# Patient Record
Sex: Female | Born: 1985 | Race: Black or African American | Hispanic: No | Marital: Single | Smoking: Current some day smoker
Health system: Southern US, Community
[De-identification: ages and names within clinical notes are randomized; demographics above are authoritative.]

## PROBLEM LIST (undated history)

## (undated) ENCOUNTER — Emergency Department (HOSPITAL_COMMUNITY): Admission: EM | Payer: Self-pay | Source: Home / Self Care

---
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
## 2008-05-31 ENCOUNTER — Ambulatory Visit: Payer: Self-pay | Admitting: Internal Medicine

## 2009-01-20 ENCOUNTER — Ambulatory Visit: Payer: Self-pay | Admitting: Internal Medicine

## 2009-05-15 ENCOUNTER — Ambulatory Visit: Payer: Self-pay | Admitting: Internal Medicine

## 2009-05-15 ENCOUNTER — Encounter: Payer: Self-pay | Admitting: Family Medicine

## 2009-05-15 LAB — CONVERTED CEMR LAB
AST: 11 units/L (ref 0–37)
Albumin: 4.5 g/dL (ref 3.5–5.2)
Alkaline Phosphatase: 87 units/L (ref 39–117)
Barbiturate Quant, Ur: NEGATIVE
Basophils Absolute: 0.1 10*3/uL (ref 0.0–0.1)
Cocaine Metabolites: NEGATIVE
Creatinine,U: 204.1 mg/dL
HDL: 40 mg/dL (ref >39)
Hemoglobin: 12.9 g/dL (ref 12.0–15.0)
LDL Cholesterol: 91 mg/dL (ref 0–99)
Lymphocytes Relative: 21 % (ref 12–46)
Neutro Abs: 6.7 10*3/uL (ref 1.7–7.7)
Neutrophils Relative %: 68 % (ref 43–77)
Phencyclidine (PCP): NEGATIVE
Platelets: 302 10*3/uL (ref 150–400)
Potassium: 4.1 meq/L (ref 3.5–5.3)
Propoxyphene: NEGATIVE
RDW: 12.6 % (ref 11.5–15.5)
Sodium: 140 meq/L (ref 135–145)
TSH: 0.904 microintl units/mL (ref 0.350–4.500)
Total Bilirubin: 0.4 mg/dL (ref 0.3–1.2)
Total Protein: 6.7 g/dL (ref 6.0–8.3)
VLDL: 7 mg/dL (ref 0–40)

## 2009-06-02 ENCOUNTER — Ambulatory Visit: Payer: Self-pay | Admitting: Internal Medicine

## 2009-06-02 ENCOUNTER — Other Ambulatory Visit: Admission: RE | Admit: 2009-06-02 | Discharge: 2009-06-02 | Payer: Self-pay | Admitting: Family Medicine

## 2009-06-02 ENCOUNTER — Encounter: Payer: Self-pay | Admitting: Family Medicine

## 2009-06-02 LAB — CONVERTED CEMR LAB: Chlamydia, DNA Probe: NEGATIVE

## 2009-07-21 ENCOUNTER — Ambulatory Visit: Payer: Self-pay | Admitting: Internal Medicine

## 2009-11-04 ENCOUNTER — Ambulatory Visit: Payer: Self-pay | Admitting: Internal Medicine

## 2009-11-20 ENCOUNTER — Ambulatory Visit: Payer: Self-pay | Admitting: Internal Medicine

## 2010-10-08 ENCOUNTER — Emergency Department (HOSPITAL_COMMUNITY): Admission: EM | Admit: 2010-10-08 | Discharge: 2010-06-16 | Payer: Self-pay | Admitting: Emergency Medicine

## 2010-11-04 ENCOUNTER — Observation Stay (HOSPITAL_COMMUNITY)
Admission: EM | Admit: 2010-11-04 | Discharge: 2010-11-04 | Payer: Self-pay | Source: Home / Self Care | Admitting: Emergency Medicine

## 2011-01-04 ENCOUNTER — Emergency Department (HOSPITAL_COMMUNITY)
Admission: EM | Admit: 2011-01-04 | Discharge: 2011-01-04 | Discharge status: Against Medical Advice | Payer: Self-pay | Attending: Emergency Medicine | Admitting: Emergency Medicine

## 2011-01-04 DIAGNOSIS — Z0389 Encounter for observation for other suspected diseases and conditions ruled out: Secondary | ICD-10-CM | POA: Insufficient documentation

## 2011-01-06 ENCOUNTER — Emergency Department (HOSPITAL_COMMUNITY)
Admission: EM | Admit: 2011-01-06 | Discharge: 2011-01-06 | Disposition: A | Discharge status: Home / Self Care | Payer: No Typology Code available for payment source | Attending: Emergency Medicine | Admitting: Emergency Medicine

## 2011-01-06 ENCOUNTER — Emergency Department (HOSPITAL_COMMUNITY): Payer: No Typology Code available for payment source

## 2011-01-06 DIAGNOSIS — R51 Headache: Secondary | ICD-10-CM | POA: Insufficient documentation

## 2011-01-06 DIAGNOSIS — S139XXA Sprain of joints and ligaments of unspecified parts of neck, initial encounter: Secondary | ICD-10-CM | POA: Insufficient documentation

## 2011-01-06 DIAGNOSIS — M542 Cervicalgia: Secondary | ICD-10-CM | POA: Insufficient documentation

## 2013-03-19 ENCOUNTER — Emergency Department (INDEPENDENT_AMBULATORY_CARE_PROVIDER_SITE_OTHER)
Admission: EM | Admit: 2013-03-19 | Discharge: 2013-03-19 | Disposition: A | Discharge status: Home / Self Care | Payer: BC Managed Care – PPO | Source: Home / Self Care | Attending: Family Medicine | Admitting: Family Medicine

## 2013-03-19 ENCOUNTER — Emergency Department (INDEPENDENT_AMBULATORY_CARE_PROVIDER_SITE_OTHER): Payer: BC Managed Care – PPO

## 2013-03-19 ENCOUNTER — Encounter (HOSPITAL_COMMUNITY): Payer: Self-pay | Admitting: Emergency Medicine

## 2013-03-19 DIAGNOSIS — M549 Dorsalgia, unspecified: Secondary | ICD-10-CM

## 2013-03-19 DIAGNOSIS — M25569 Pain in unspecified knee: Secondary | ICD-10-CM

## 2013-03-19 DIAGNOSIS — M25519 Pain in unspecified shoulder: Secondary | ICD-10-CM

## 2013-03-19 MED ORDER — DICLOFENAC POTASSIUM 50 MG PO TABS: 50.0000 mg | ORAL_TABLET | Freq: Three times a day (TID) | ORAL | Status: AC
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
## 2013-03-19 NOTE — ED Notes (Signed)
Instructed to put on gown for physician examination 

## 2015-07-02 ENCOUNTER — Encounter (HOSPITAL_COMMUNITY): Payer: Self-pay | Admitting: *Deleted

## 2015-07-02 ENCOUNTER — Emergency Department (HOSPITAL_COMMUNITY)
Admission: EM | Admit: 2015-07-02 | Discharge: 2015-07-02 | Disposition: A | Discharge status: Home / Self Care | Payer: Self-pay | Attending: Emergency Medicine | Admitting: Emergency Medicine

## 2015-07-02 ENCOUNTER — Encounter (HOSPITAL_COMMUNITY): Payer: Self-pay | Admitting: Family Medicine

## 2015-07-02 ENCOUNTER — Emergency Department (INDEPENDENT_AMBULATORY_CARE_PROVIDER_SITE_OTHER)
Admission: EM | Admit: 2015-07-02 | Discharge: 2015-07-02 | Disposition: A | Discharge status: Other Acute Inpatient Hospital | Payer: Self-pay | Source: Home / Self Care | Attending: Family Medicine | Admitting: Family Medicine

## 2015-07-02 DIAGNOSIS — L03317 Cellulitis of buttock: Secondary | ICD-10-CM | POA: Insufficient documentation

## 2015-07-02 DIAGNOSIS — Z720 Tobacco use: Secondary | ICD-10-CM | POA: Insufficient documentation

## 2015-07-02 DIAGNOSIS — L0231 Cutaneous abscess of buttock: Secondary | ICD-10-CM | POA: Insufficient documentation

## 2015-07-02 DIAGNOSIS — Z791 Long term (current) use of non-steroidal anti-inflammatories (NSAID): Secondary | ICD-10-CM | POA: Insufficient documentation

## 2015-07-02 DIAGNOSIS — L0501 Pilonidal cyst with abscess: Secondary | ICD-10-CM

## 2015-07-02 DIAGNOSIS — L0291 Cutaneous abscess, unspecified: Secondary | ICD-10-CM

## 2015-07-02 DIAGNOSIS — L0390 Cellulitis, unspecified: Secondary | ICD-10-CM

## 2015-07-02 MED ORDER — LIDOCAINE-EPINEPHRINE 2 %-1:100000 IJ SOLN
20.0000 mL | Freq: Once | INTRAMUSCULAR | Status: AC
  Administered 2015-07-02: 20 mL via INTRADERMAL
  Filled 2015-07-02: qty 20

## 2015-07-02 MED ORDER — CEPHALEXIN 500 MG PO CAPS: 500.0000 mg | ORAL_CAPSULE | Freq: Two times a day (BID) | ORAL | Status: DC

## 2015-07-02 NOTE — ED Notes (Signed)
Pt  Reports  Symptoms  Of  An  abcess      Swollen  Tender  Area to  Top  Of  Her  Buttock  Area    X  1 week         she  Reports  It is  Tender  To the  Touch

## 2015-07-02 NOTE — ED Provider Notes (Addendum)
CSN: [id number]     Arrival date & time 07/02/15  1609 History   First MD Initiated Contact with Patient 07/02/15 1637     Chief Complaint  Patient presents with  . Abscess   (Consider location/radiation/quality/duration/timing/severity/associated sxs/prior Treatment) Patient is a 29 y.o. female presenting with abscess. The history is provided by the patient.  Abscess Location:  Ano-genital Ano-genital abscess location:  Gluteal cleft Abscess quality: fluctuance, painful and redness   Abscess quality: not draining   Red streaking: no   Duration:  1 week Progression:  Worsening Pain details:    Quality:  Throbbing   Severity:  Moderate   Progression:  Worsening Chronicity:  Recurrent Relieved by:  None tried Worsened by:  Nothing tried Ineffective treatments:  None tried Risk factors: prior abscess     History reviewed. No pertinent past medical history. History reviewed. No pertinent past surgical history. History reviewed. No pertinent family history. Social History  Substance Use Topics  . Smoking status: Current Every Day Smoker  . Smokeless tobacco: None  . Alcohol Use: Yes   OB History    No data available     Review of Systems  Constitutional: Negative.   Gastrointestinal: Positive for rectal pain.  Skin: Positive for rash.    Allergies  Review of patient's allergies indicates no known allergies.  Home Medications   Prior to Admission medications   Medication Sig Start Date End Date Taking? Authorizing Provider  cephALEXin (KEFLEX) 500 MG capsule Take 1 capsule (500 mg total) by mouth 2 (two) times daily. 07/02/15   Barrett Henle, PA-C  diclofenac (CATAFLAM) 50 MG tablet Take 1 tablet (50 mg total) by mouth 3 (three) times daily. 03/19/13   Linna Hoff, MD   Meds Ordered and Administered this Visit  Medications - No data to display  BP 108/66 mmHg  Pulse 93  Temp(Src) 98.1 F (36.7 C)  Resp 16  SpO2 98%  LMP 07/02/2015 No data  found.   Physical Exam  Constitutional: She is oriented to person, place, and time. She appears well-developed and well-nourished. She appears distressed.  Abdominal: Soft. Bowel sounds are normal.  Genitourinary:     Neurological: She is alert and oriented to person, place, and time.  Skin: Skin is warm and dry. There is erythema.  Nursing note and vitals reviewed.   ED Course  Procedures (including critical care time)  Labs Review Labs Reviewed - No data to display  Imaging Review No results found.   Visual Acuity Review  Right Eye Distance:   Left Eye Distance:   Bilateral Distance:    Right Eye Near:   Left Eye Near:    Bilateral Near:         MDM   1. Pilonidal cyst with abscess        Linna Hoff, MD 07/02/15 1710  Linna Hoff, MD 07/02/15 2119

## 2015-07-02 NOTE — ED Notes (Signed)
Pt here for abscess to top of buttocks x 1 week. sts draining.

## 2015-07-02 NOTE — Discharge Instructions (Signed)
Please take Keflex as prescribed.  Please follow up with a primary care provider listed in the Resource Guide provided below. Please return to the Emergency Department if symptoms worsen or new onset of fever, abdominal pain, nausea, vomiting, diarrhea, constipation, blood in stool.    Emergency Department Resource Guide 1) Find a Doctor and Pay Out of Pocket Although you won't have to find out who is covered by your insurance plan, it is a good idea to ask around and get recommendations. You will then need to call the office and see if the doctor you have chosen will accept you as a new patient and what types of options they offer for patients who are self-pay. Some doctors offer discounts or will set up payment plans for their patients who do not have insurance, but you will need to ask so you aren't surprised when you get to your appointment.  2) Contact Your Local Health Department Not all health departments have doctors that can see patients for sick visits, but many do, so it is worth a call to see if yours does. If you don't know where your local health department is, you can check in your phone book. The CDC also has a tool to help you locate your state's health department, and many state websites also have listings of all of their local health departments.  3) Find a Walk-in Clinic If your illness is not likely to be very severe or complicated, you may want to try a walk in clinic. These are popping up all over the country in pharmacies, drugstores, and shopping centers. They're usually staffed by nurse practitioners or physician assistants that have been trained to treat common illnesses and complaints. They're usually fairly quick and inexpensive. However, if you have serious medical issues or chronic medical problems, these are probably not your best option.  No Primary Care Doctor: - Call Health Connect at  [phone] - they can help you locate a primary care doctor that  accepts your  insurance, provides certain services, etc. - Physician Referral Service- [phone]  Chronic Pain Problems: Organization         Address  Phone   Notes  [facility]  [phone] Patients need to be referred by their primary care doctor.   Medication Assistance: Organization         Address  Phone   Notes  [facility] Medication [facility] [address] [place] [place], [place] [address] [phone] --Must be a resident of [facility] -- Must have NO insurance coverage whatsoever (no Medicaid/ Medicare, etc.) -- The pt. MUST have a primary care doctor that directs their care regularly and follows them in the community   [facility]ist  [phone]   [employer]  [phone]    Agencies that provide inexpensive medical care: Organization         Address  Phone   Notes  [facility]  [phone]   [facility]    [phone]   [facility] [address] [place], [place] [address] [phone]   [facility] [address] [place], [place] [phone]   Planned Parenthood    [phone]   [facility]    [phone]   [facility]  [address], [place] Phone:  [phone], Fax:  [phone] Hours of Operation:  9 am - 6 pm, M-F.  Also accepts Medicaid/Medicare and self-pay.  [facility] for [place], [address], [place] Phone: [phone], Fax: [phone]. Hours of Operation:  8:30 am - 5:30 pm, M-F.  Also accepts Medicaid and self-pay.  [facility] [address], [place] Phone: [phone]   [place], [place], [place] [phone], Ext. [phone] Mondays & Thursdays: 7-9 AM.  First 15 patients are seen on a first come, first serve basis.    [place] Providers:  Organization          Address  Phone   Notes  [facility] [address], [place] [phone] Also accepts self-pay patients.  [facility] [address] [place] [address], [place]  [phone]   [place], [address], [place] [phone]   [facility] [address], [place] [phone]   Lucianne Lei [address], [place]   [phone] Only accepts [place] Access [place] patients after they have their name applied to their card.   Self-Pay (no insurance) in [facility]:  Organization         Address  Phone   Notes  Sickle Cell Patients, [facility] Internal Medicine [place] [phone]   [facility] Urgent Care [place] [phone]   [facility] [place]  [place], [place], [place] [phone]   [facility]/Dr. Osei-Bonsu  [address] [place] or [address], [place] [phone] Phone number for both [place] and [place] locations is the same.  Urgent Medical and [facility] [address], [place] [phone]   [facility] [address], [place] or [address] [phone]   [facility] [address] [place] [phone], phone; [phone], fax Sees patients 1st and 3rd Saturday of every month.  Must not qualify for public or private insurance (i.e. Medicaid, Medicare, [place] Health Choice, Veterans' Benefits)  Household income should be no more than 200% of the poverty level The clinic cannot treat you if you are pregnant or think you are pregnant  Sexually transmitted diseases are not treated at the clinic.    Dental Care: Organization         Address  Phone  Notes  [facility] Department of [facility] [place] [phone] Accepts children up to age 40 who are enrolled in [place] or [place]; pregnant women with a Medicaid card; and children who have applied for Medicaid or [place] Health Choice, but were declined, whose parents can pay a reduced fee at time of service.  [facility] Department of [facility]  [address], [place] [phone] Accepts children up to age 10 who are enrolled in [place] or [place]; pregnant women with a Medicaid card; and children who have applied for Medicaid or [place] Health Choice, but were declined, whose parents can pay a reduced fee at time of service.  [place] Adult Dental Access PROGRAM  [place] [phone] Patients are seen by appointment only. Walk-ins are not accepted. [place] will see patients 12 years of age and older. Monday - Tuesday (  8am-5pm) Most Wednesdays (8:30-5pm) $30 per visit, cash only  [facility] Adult Dental Access PROGRAM  [address], [facility] [phone] Patients are seen by appointment only. Walk-ins are not accepted. [place] will see patients 35 years of age and older. One Wednesday Evening (Monthly: Volunteer Based).  $30 per visit, cash only  [place]  [phone] for adults; Children under age 38, call Graduate Pediatric Dentistry at [phone]. Children aged 78-14, please call [phone] to request a pediatric application.  Dental services are provided in all areas of dental care including fillings, crowns and bridges, complete and partial dentures, implants, gum treatment, root canals, and extractions. Preventive care is also provided. Treatment is provided to both adults and children. Patients are selected via a lottery and there is often a waiting list.   [facility] [address] [place]  [phone] [email or web address]   [facility] [address] [place], [place]  [phone], Ext. [phone] Second and Fourth Thursday of each month, opens at 6:30 AM; Clinic ends at 9 AM.  Patients are seen on a first-come first-served basis, and a limited number are seen during each clinic.   [facility]  [address] Hillard Danker [place], [place] [phone]   Eligibility Requirements You must have lived in [place], [place], or [place] counties for at least the last three months.   You cannot be eligible for state or federal sponsored [employer], including [employer], [place], or [employer].   You generally cannot be eligible for healthcare insurance through your employer.    How to apply: Eligibility screenings are held every Tuesday and Wednesday afternoon from 1:00 pm until 4:00 pm. You do not need an appointment for the interview!  [facility] [address] [place], [place]   [place]  [place] Department  [place]  [phone]    Behavioral Health Resources in the Community: Intensive Outpatient Programs Organization         Address  Phone  Notes  [place]. [address] [place], [place] [phone]   [facility] [address] [place], [place]   ADS: Alcohol & Drug Svcs [address], [place], [place]   [place] [address]  [place], [place] or [phone]   Substance Abuse Resources Organization         Address  Phone  Notes  Alcohol and Drug Services  [phone]   [place]  [phone]   [facility] [place]   [place]  [phone]   Residential & Outpatient Substance Abuse Program  [phone]   Psychological Services Organization         Address  Phone  Notes  [facility] [place]  [place]  [phone]    [place] [address], [place] or [phone]    Mobile Crisis Teams Organization         Address  Phone  Notes  [facility], Mobile Crisis Care Unit  [phone]   [facility]  [address] [place], [place]   [place] [address] [place] [phone]    Self-Help/Support Groups Organization         Address  Phone  Notes  [facility]ental [facility]oc[facility] of [place] - variety of support groups  [place] Call for more information  Narcotics Anonymous (NA), [facility]aring [facility] [address], [employer] [place]  2 meetings at this location   Special educational needs teacher         Address  Phone  Notes  [facility]idential Treatment [place],    [place]  [phone]   [facility]  [address], [place] [phone], [place], [place]   [place], [place] [phone] Admissions: 8am-3pm M-F  Incentives Substance [place] [address],    [place], [place] [phone]   [facility] [address] [place], [place], [place]   [facility] [address]  [place], [place]   [facility]ight Programs - Intensive Outpatient [address], [place], [place]   [facility] ([place].,  [place], [place] [phone] or [phone]   [facility] Services (RTS) [address] [place], [place] Accepts Medicaid  [facility] [address],  [place] [phone] Substance Abuse/Addiction Treatment   [facility] Organization         Address  Phone  Notes  [facility]  [phone]   Domenic Schwab, PhD [address] Arlis [place], [place]   [phone] or [phone]   [place]  [place], [place] [phone]   [facility] [address], [place], [place] [phone] Insurance/Medicaid/sponsorship through [facility] and Families [address], [address] [place]                                    [place], [place] [phone] [place] [address], [place] [phone]    Dr. Adele Schilder  [phone]   [facility] Dept. 1) [address] [place] 2) [place] 3)  [place] [address], [place] [phone]  [phone]   [place] [phone] or [phone] (After Hours)

## 2015-07-02 NOTE — ED Provider Notes (Signed)
CSN: [id number]     Arrival date & time 07/02/15  1739 History   First MD Initiated Contact with Patient 07/02/15 1807     Chief Complaint  Patient presents with  . Abscess     (Consider location/radiation/quality/duration/timing/severity/associated sxs/prior Treatment) HPI Comments: Pt is a 29 yo female with history of abscesses who presents to the ED with complaint of abscess on the top of her right buttocks, onset 1 week. Pt reports the abscess and pain worsened over the past few days. She notes she has tried using ichthammol ointment and taking baths at home (which worked for her in the past with an abscess in the same location) without relief. Denies fever, drainage, diarrhea, constipation, blood in stool.   Patient is a 29 y.o. female presenting with abscess.  Abscess Associated symptoms: no fever     History reviewed. No pertinent past medical history. History reviewed. No pertinent past surgical history. History reviewed. No pertinent family history. Social History  Substance Use Topics  . Smoking status: Current Every Day Smoker  . Smokeless tobacco: None  . Alcohol Use: Yes   OB History    No data available     Review of Systems  Constitutional: Negative for fever.  Gastrointestinal: Negative for diarrhea, constipation and blood in stool.  Skin:       Abscess       Allergies  Review of patient's allergies indicates no known allergies.  Home Medications   Prior to Admission medications   Medication Sig Start Date End Date Taking? Authorizing Provider  diclofenac (CATAFLAM) 50 MG tablet Take 1 tablet (50 mg total) by mouth 3 (three) times daily. 03/19/13   Linna Hoff, MD   BP 109/49 mmHg  Pulse 93  Temp(Src) 97.9 F (36.6 C) (Oral)  Resp 18  Wt 224 lb (101.606 kg)  SpO2 98%  LMP 07/02/2015 Physical Exam  Constitutional: She is oriented to person, place, and time. She appears well-developed and well-nourished.  HENT:  Head: Normocephalic and  atraumatic.  Eyes: Conjunctivae and EOM are normal. Right eye exhibits no discharge. Left eye exhibits no discharge. No scleral icterus.  Cardiovascular: Normal rate.   Pulmonary/Chest: Effort normal.  Neurological: She is alert and oriented to person, place, and time.  Skin:  4x3cm abscess located at the medial aspect of the right buttocks to the pilonidal area with associated induration and fluctuance and small amount of surrounding erythema, no drainage.   Nursing note and vitals reviewed.   ED Course  Procedures (including critical care time) Labs Review Labs Reviewed - No data to display  Imaging Review No results found. I have personally reviewed and evaluated these images and lab results as part of my medical decision-making.  INCISION AND DRAINAGE Performed by: Barrett Henle Consent: Verbal consent obtained. Risks and benefits: risks, benefits and alternatives were discussed Type: abscess  Body area: right superior buttocks near pilonidal region  Anesthesia: local infiltration  Incision was made with a scalpel.  Local anesthetic: lidocaine 2% with epinephrine  Anesthetic total: 3 ml  Complexity: complex Blunt dissection to break up loculations  Drainage: purulent  Drainage amount: moderate  Patient tolerance: Patient tolerated the procedure well with no immediate complications.     MDM   Final diagnoses:  Abscess and cellulitis    Pt presents with pilonidal abscess. VSS, afebrile. I&D performed without any complications. Will plan to d/c pt home with rx for Keflex due to abscess with cellulitis. Pt advised to follow up  with a PCP from resource guide provided in d/c paperwork. Discussed findings/results and plan with patient/guardian, who agrees with plan. All questions answered. Return precautions discussed and outpatient follow up given.       Satira Sark [place], [place] 07/02/15 1906  Derwood Kaplan, MD 07/04/15 [phone]

## 2018-04-23 ENCOUNTER — Encounter (HOSPITAL_COMMUNITY): Payer: Self-pay | Admitting: Emergency Medicine

## 2018-04-23 ENCOUNTER — Emergency Department (HOSPITAL_COMMUNITY)
Admission: EM | Admit: 2018-04-23 | Discharge: 2018-04-23 | Disposition: A | Discharge status: Home / Self Care | Payer: Self-pay | Attending: Emergency Medicine | Admitting: Emergency Medicine

## 2018-04-23 ENCOUNTER — Emergency Department (HOSPITAL_COMMUNITY): Payer: Self-pay

## 2018-04-23 DIAGNOSIS — K529 Noninfective gastroenteritis and colitis, unspecified: Secondary | ICD-10-CM | POA: Insufficient documentation

## 2018-04-23 DIAGNOSIS — R079 Chest pain, unspecified: Secondary | ICD-10-CM | POA: Insufficient documentation

## 2018-04-23 DIAGNOSIS — Z79899 Other long term (current) drug therapy: Secondary | ICD-10-CM | POA: Insufficient documentation

## 2018-04-23 DIAGNOSIS — F17210 Nicotine dependence, cigarettes, uncomplicated: Secondary | ICD-10-CM | POA: Insufficient documentation

## 2018-04-23 DIAGNOSIS — R1013 Epigastric pain: Secondary | ICD-10-CM

## 2018-04-23 LAB — CBC
HEMATOCRIT: 40.8 % (ref 36.0–46.0)
Hemoglobin: 13.4 g/dL (ref 12.0–15.0)
MCH: 28.4 pg (ref 26.0–34.0)
MCHC: 32.8 g/dL (ref 30.0–36.0)
MCV: 86.4 fL (ref 78.0–100.0)
PLATELETS: 347 10*3/uL (ref 150–400)
RBC: 4.72 MIL/uL (ref 3.87–5.11)
RDW: 12.3 % (ref 11.5–15.5)
WBC: 12.1 10*3/uL — ABNORMAL HIGH (ref 4.0–10.5)

## 2018-04-23 LAB — COMPREHENSIVE METABOLIC PANEL
ALBUMIN: 3.8 g/dL (ref 3.5–5.0)
ALT: 10 U/L — AB (ref 14–54)
AST: 11 U/L — AB (ref 15–41)
Alkaline Phosphatase: 101 U/L (ref 38–126)
Anion gap: 8 (ref 5–15)
BILIRUBIN TOTAL: 1 mg/dL (ref 0.3–1.2)
BUN: 9 mg/dL (ref 6–20)
CO2: 26 mmol/L (ref 22–32)
CREATININE: 0.82 mg/dL (ref 0.44–1.00)
Calcium: 9.3 mg/dL (ref 8.9–10.3)
Chloride: 104 mmol/L (ref 101–111)
GFR calc Af Amer: >60 mL/min (ref >60)
GFR calc non Af Amer: >60 mL/min (ref >60)
Glucose, Bld: 97 mg/dL (ref 65–99)
POTASSIUM: 3.4 mmol/L — AB (ref 3.5–5.1)
Sodium: 138 mmol/L (ref 135–145)
TOTAL PROTEIN: 7 g/dL (ref 6.5–8.1)

## 2018-04-23 LAB — URINALYSIS, ROUTINE W REFLEX MICROSCOPIC
Bilirubin Urine: NEGATIVE
Glucose, UA: NEGATIVE mg/dL
Hgb urine dipstick: NEGATIVE
KETONES UR: 80 mg/dL — AB
Leukocytes, UA: NEGATIVE
NITRITE: NEGATIVE
PH: 6 (ref 5.0–8.0)
Protein, ur: NEGATIVE mg/dL
Specific Gravity, Urine: 1.031 — ABNORMAL HIGH (ref 1.005–1.030)

## 2018-04-23 LAB — I-STAT TROPONIN, ED: Troponin i, poc: 0 ng/mL (ref 0.00–0.08)

## 2018-04-23 LAB — I-STAT BETA HCG BLOOD, ED (MC, WL, AP ONLY): I-stat hCG, quantitative: <5 m[IU]/mL (ref <5)

## 2018-04-23 LAB — LIPASE, BLOOD: Lipase: 25 U/L (ref 11–51)

## 2018-04-23 MED ORDER — OMEPRAZOLE 20 MG PO CPDR: 20.0000 mg | DELAYED_RELEASE_CAPSULE | Freq: Every day | ORAL | 0 refills | Status: DC

## 2018-04-23 MED ORDER — ONDANSETRON HCL 4 MG/2ML IJ SOLN
4.0000 mg | Freq: Once | INTRAMUSCULAR | Status: AC
  Administered 2018-04-23: 4 mg via INTRAVENOUS
  Filled 2018-04-23: qty 2

## 2018-04-23 MED ORDER — ONDANSETRON 4 MG PO TBDP: 4.0000 mg | ORAL_TABLET | Freq: Three times a day (TID) | ORAL | 0 refills | Status: AC | PRN

## 2018-04-23 MED ORDER — GI COCKTAIL ~~LOC~~
30.0000 mL | Freq: Once | ORAL | Status: AC
  Administered 2018-04-23: 30 mL via ORAL
  Filled 2018-04-23: qty 30

## 2018-04-23 MED ORDER — SODIUM CHLORIDE 0.9 % IV BOLUS
1000.0000 mL | Freq: Once | INTRAVENOUS | Status: AC
  Administered 2018-04-23: 1000 mL via INTRAVENOUS

## 2018-04-23 NOTE — ED Notes (Signed)
Patient transported to X-ray 

## 2018-04-23 NOTE — ED Provider Notes (Signed)
[facility] EMERGENCY DEPARTMENT Provider Note   CSN: [id number] Arrival date & time: 04/23/18  1430     History   Chief Complaint Chief Complaint  Patient presents with  . Abdominal Pain    HPI Robin Brandt is a 32 y.o. female with no significant past medical history who presents today for evaluation of epigastric abdominal pain.  She reports that 5 days ago she started having she thought was a virus with nausea,  and diarrhea.  Reports that diarrhea has mostly resolved, however she has had significantly decreased p.o. intake.  She feels like she has a burning sensation in the middle of her chest that feels like it is not.  She has tried Tums at home without significant relief.  She denies any fevers.  No shortness of breath.  She reports that she has never had abdominal surgery in the past.  She does not feel like her symptoms either started or improved with food.  She denies hemoptysis, no leg swelling, personal history of prior PE or DVT or hormone use.  HPI  History reviewed. No pertinent past medical history.  There are no active problems to display for this patient.   History reviewed. No pertinent surgical history.   OB History   None      Home Medications    Prior to Admission medications   Medication Sig Start Date End Date Taking? Authorizing Provider  cephALEXin (KEFLEX) 500 MG capsule Take 1 capsule (500 mg total) by mouth 2 (two) times daily. 07/02/15   Barrett Henle, PA-C  diclofenac (CATAFLAM) 50 MG tablet Take 1 tablet (50 mg total) by mouth 3 (three) times daily. 03/19/13   Linna Hoff, MD  omeprazole (PRILOSEC) 20 MG capsule Take 1 capsule (20 mg total) by mouth daily for 14 days. 04/23/18 05/07/18  Cristina Gong, PA-C  ondansetron (ZOFRAN ODT) 4 MG disintegrating tablet Take 1 tablet (4 mg total) by mouth every 8 (eight) hours as needed for nausea or vomiting. 04/23/18   Cristina Gong, PA-C    Family  History No family history on file.  Social History Social History   Tobacco Use  . Smoking status: Current Every Day Smoker    Packs/day: 1.00    Types: Cigarettes  Substance Use Topics  . Alcohol use: Yes    Comment: occ  . Drug use: No     Allergies   Patient has no known allergies.   Review of Systems Review of Systems  Constitutional: Negative for chills and fever.  Respiratory: Negative for shortness of breath.   Cardiovascular: Positive for chest pain. Negative for palpitations.  Gastrointestinal: Positive for abdominal pain, diarrhea and nausea. Negative for constipation.  All other systems reviewed and are negative.    Physical Exam Updated Vital Signs BP (!) 109/57   Pulse (!) 54   Temp 98.6 F (37 C)   Resp 19   Ht 5\' 6"  (1.676 m)   Wt 95.3 kg (210 lb)   LMP 04/02/2018 (Approximate)   SpO2 99%   BMI 33.89 kg/m   Physical Exam  Constitutional: She appears well-developed and well-nourished. No distress.  HENT:  Head: Normocephalic and atraumatic.  Mouth/Throat: Oropharynx is clear and moist.  Eyes: Conjunctivae are normal.  Neck: Neck supple.  Cardiovascular: Normal rate, regular rhythm and intact distal pulses.  No murmur heard. No leg swelling bilaterally.  Pulmonary/Chest: Effort normal and breath sounds normal. No accessory muscle usage. No respiratory distress. She  has no decreased breath sounds. She has no wheezes. She has no rhonchi.  Abdominal: Soft. Normal appearance. Bowel sounds are increased. There is tenderness in the right upper quadrant, epigastric area and left upper quadrant.  Musculoskeletal: She exhibits no edema.  Neurological: She is alert.  Skin: Skin is warm and dry.  Psychiatric: She has a normal mood and affect.  Nursing note and vitals reviewed.    ED Treatments / Results  Labs (all labs ordered are listed, but only abnormal results are displayed) Labs Reviewed  COMPREHENSIVE METABOLIC PANEL - Abnormal; Notable for  the following components:      Result Value   Potassium 3.4 (*)    AST 11 (*)    ALT 10 (*)    All other components within normal limits  CBC - Abnormal; Notable for the following components:   WBC 12.1 (*)    All other components within normal limits  URINALYSIS, ROUTINE W REFLEX MICROSCOPIC - Abnormal; Notable for the following components:   APPearance HAZY (*)    Specific Gravity, Urine 1.031 (*)    Ketones, ur 80 (*)    All other components within normal limits  LIPASE, BLOOD  I-STAT BETA HCG BLOOD, ED ([facility], [facility], [facility] ONLY)  I-STAT TROPONIN, ED    EKG None  Radiology Dg Chest 2 View  Result Date: 04/23/2018 CLINICAL DATA:  Epigastric pain for the past 6 days with intermittent nausea and diarrhea. EXAM: CHEST - 2 VIEW COMPARISON:  None. FINDINGS: The heart is at the upper limits of normal in size. Normal mediastinal contours. Normal pulmonary vascularity. No focal consolidation, pleural effusion, or pneumothorax. No acute osseous abnormality. IMPRESSION: No active cardiopulmonary disease. Electronically Signed   By: Obie DredgeWilliam T Derry M.D.   On: 04/23/2018 17:06    Procedures Procedures (including critical care time)  Medications Ordered in ED Medications  sodium chloride 0.9 % bolus 1,000 mL (0 mLs Intravenous Stopped 04/23/18 1807)  gi cocktail (Maalox,Lidocaine,Donnatal) (30 mLs Oral Given 04/23/18 1615)  ondansetron (ZOFRAN) injection 4 mg (4 mg Intravenous Given 04/23/18 1615)  sodium chloride 0.9 % bolus 1,000 mL (0 mLs Intravenous Stopped 04/23/18 1935)     Initial Impression / Assessment and Plan / ED Course  I have reviewed the triage vital signs and the nursing notes.  Pertinent labs & imaging results that were available during my care of the patient were reviewed by me and considered in my medical decision making (see chart for details).  Clinical Course as of Apr 23 2149  Sun Apr 23, 2018  1924 Patient re-evaluated, she wishes to go home.  Patient discharged.     [EH]    Clinical Course User Index [EH] Cristina GongHammond, Kristyanna Barcelo W, PA-C   Patient with symptoms consistent with viral gastroenteritis, and GERD.  Vitals are stable, she has no fever.  She did initially appear slightly dehydrated, however she was given 2 L of IV fluids and p.o. rehydration after which she reported improvement in her symptoms.  Lungs were clear, however given epigastric nature of pain troponin was obtained which was not elevated, EKG without acute abnormalities.  She was given a GI cocktail with significant relief of her symptoms.  PERC negative . discussed the role of CT scan with patient who elected for symptomatic treatment.  Patient exam not consistent with appendicitis, cholecystitis, pancreatitis, ruptured viscus.  Urine without obvious infection.  She is not pregnant, and the pain is epigastric in nature, therefore not suspicious for OB/GYN because of her pain  and symptoms.  Return precautions were discussed.  Patient given Zofran prescription, along with instructions on Prilosec, and bland eating.  Patient discharged home.  Return precautions discussed.  Final Clinical Impressions(s) / ED Diagnoses   Final diagnoses:  Gastroenteritis  Epigastric pain    ED Discharge Orders        Ordered    ondansetron (ZOFRAN ODT) 4 MG disintegrating tablet  Every 8 hours PRN     04/23/18 1921    omeprazole (PRILOSEC) 20 MG capsule  Daily     04/23/18 1921       Norman Clay 04/23/18 2150    Vanetta Mulders, MD 04/26/18 [id number]

## 2018-04-23 NOTE — Discharge Instructions (Signed)
Today I have given you prescriptions for Zofran and Prilosec.  Prilosec is available over-the-counter, however Zofran requires the prescription.  I have given you follow-up with gastroenterology, if your symptoms persist then please follow-up with them.  If your symptoms worsen or you have additional concerns please seek additional medical care and evaluation.  As discussed today we did not perform a CT scan, and that would still be a possible option to work-up your symptoms.

## 2018-04-23 NOTE — ED Triage Notes (Signed)
Patient to ED c/o epigastric pain x 6 days, like a "knot is there and when it releases, she feels hungry." Patient endorsing 4 days of intermittent nausea and diarrhea as well. Denies acid reflux or vomiting, no fevers/chills.

## 2018-05-11 ENCOUNTER — Emergency Department (HOSPITAL_COMMUNITY)
Admission: EM | Admit: 2018-05-11 | Discharge: 2018-05-11 | Disposition: A | Discharge status: Home / Self Care | Payer: Self-pay | Attending: Emergency Medicine | Admitting: Emergency Medicine

## 2018-05-11 ENCOUNTER — Emergency Department (HOSPITAL_COMMUNITY): Payer: Self-pay

## 2018-05-11 ENCOUNTER — Encounter (HOSPITAL_COMMUNITY): Payer: Self-pay

## 2018-05-11 ENCOUNTER — Other Ambulatory Visit: Payer: Self-pay

## 2018-05-11 DIAGNOSIS — Z79899 Other long term (current) drug therapy: Secondary | ICD-10-CM | POA: Insufficient documentation

## 2018-05-11 DIAGNOSIS — F17210 Nicotine dependence, cigarettes, uncomplicated: Secondary | ICD-10-CM | POA: Insufficient documentation

## 2018-05-11 DIAGNOSIS — K2900 Acute gastritis without bleeding: Secondary | ICD-10-CM | POA: Insufficient documentation

## 2018-05-11 LAB — CBC WITH DIFFERENTIAL/PLATELET
Abs Immature Granulocytes: 0 10*3/uL (ref 0.0–0.1)
BASOS ABS: 0.1 10*3/uL (ref 0.0–0.1)
Basophils Relative: 1 %
Eosinophils Absolute: 0.2 10*3/uL (ref 0.0–0.7)
Eosinophils Relative: 2 %
HCT: 43 % (ref 36.0–46.0)
HEMOGLOBIN: 14.3 g/dL (ref 12.0–15.0)
IMMATURE GRANULOCYTES: 0 %
LYMPHS ABS: 2.1 10*3/uL (ref 0.7–4.0)
LYMPHS PCT: 21 %
MCH: 28.9 pg (ref 26.0–34.0)
MCHC: 33.3 g/dL (ref 30.0–36.0)
MCV: 86.9 fL (ref 78.0–100.0)
Monocytes Absolute: 0.7 10*3/uL (ref 0.1–1.0)
Monocytes Relative: 7 %
NEUTROS ABS: 6.9 10*3/uL (ref 1.7–7.7)
NEUTROS PCT: 69 %
Platelets: 387 10*3/uL (ref 150–400)
RBC: 4.95 MIL/uL (ref 3.87–5.11)
RDW: 12.7 % (ref 11.5–15.5)
WBC: 10.1 10*3/uL (ref 4.0–10.5)

## 2018-05-11 LAB — COMPREHENSIVE METABOLIC PANEL
ALT: 10 U/L (ref 0–44)
AST: 15 U/L (ref 15–41)
Albumin: 4.4 g/dL (ref 3.5–5.0)
Alkaline Phosphatase: 92 U/L (ref 38–126)
Anion gap: 12 (ref 5–15)
BUN: 10 mg/dL (ref 6–20)
CHLORIDE: 104 mmol/L (ref 98–111)
CO2: 25 mmol/L (ref 22–32)
Calcium: 9.8 mg/dL (ref 8.9–10.3)
Creatinine, Ser: 0.9 mg/dL (ref 0.44–1.00)
Glucose, Bld: 84 mg/dL (ref 70–99)
POTASSIUM: 3.9 mmol/L (ref 3.5–5.1)
SODIUM: 141 mmol/L (ref 135–145)
Total Bilirubin: 0.8 mg/dL (ref 0.3–1.2)
Total Protein: 7.3 g/dL (ref 6.5–8.1)

## 2018-05-11 LAB — URINALYSIS, ROUTINE W REFLEX MICROSCOPIC
GLUCOSE, UA: NEGATIVE mg/dL
Ketones, ur: 80 mg/dL — AB
LEUKOCYTES UA: NEGATIVE
Nitrite: NEGATIVE
PH: 5 (ref 5.0–8.0)
Protein, ur: 100 mg/dL — AB
Specific Gravity, Urine: 1.032 — ABNORMAL HIGH (ref 1.005–1.030)

## 2018-05-11 LAB — I-STAT BETA HCG BLOOD, ED (MC, WL, AP ONLY): I-stat hCG, quantitative: <5 m[IU]/mL (ref <5)

## 2018-05-11 LAB — LIPASE, BLOOD: LIPASE: 28 U/L (ref 11–51)

## 2018-05-11 MED ORDER — IOHEXOL 300 MG/ML  SOLN
100.0000 mL | Freq: Once | INTRAMUSCULAR | Status: AC | PRN
  Administered 2018-05-11: 100 mL via INTRAVENOUS

## 2018-05-11 MED ORDER — OMEPRAZOLE 20 MG PO CPDR: 20.0000 mg | DELAYED_RELEASE_CAPSULE | Freq: Every day | ORAL | 0 refills | Status: AC

## 2018-05-11 MED ORDER — ONDANSETRON 4 MG PO TBDP
4.0000 mg | ORAL_TABLET | Freq: Once | ORAL | Status: AC
  Administered 2018-05-11: 4 mg via ORAL
  Filled 2018-05-11: qty 1

## 2018-05-11 MED ORDER — RANITIDINE HCL 150 MG PO CAPS: 150.0000 mg | ORAL_CAPSULE | Freq: Every day | ORAL | 0 refills | Status: AC

## 2018-05-11 MED ORDER — RANITIDINE HCL 150 MG/10ML PO SYRP
150.0000 mg | ORAL_SOLUTION | Freq: Once | ORAL | Status: AC
  Administered 2018-05-11: 150 mg via ORAL
  Filled 2018-05-11: qty 10

## 2018-05-11 MED ORDER — ONDANSETRON HCL 4 MG PO TABS: 4.0000 mg | ORAL_TABLET | Freq: Four times a day (QID) | ORAL | 0 refills | Status: AC

## 2018-05-11 MED ORDER — GI COCKTAIL ~~LOC~~
30.0000 mL | Freq: Once | ORAL | Status: AC
  Administered 2018-05-11: 30 mL via ORAL
  Filled 2018-05-11: qty 30

## 2018-05-11 NOTE — ED Provider Notes (Signed)
[facility] EMERGENCY DEPARTMENT Provider Note   CSN: [id number] Arrival date & time: 05/11/18  1629     History   Chief Complaint Chief Complaint  Patient presents with  . Nausea  . Emesis    HPI Robin Brandt is a 32 y.o. female.  HPI   32 year old female presents with complaints of nausea and abdominal pain.patient notes that over the last several weeks she has had epigastric abdominal pain, burning up into her throat and feelings of indigestion. Patient notes that she was seen in the emergency room and given a prescription for Zofran and Prilosec. She notes this has helped, she notes generally feeling better but continues to have the nausea and the reflux with the epigastric pain. Patient denies any fever, denies any inability to tolerate by mouth, she denies any lower abdominal pain, vaginal discharge or bleeding.  History reviewed. No pertinent past medical history.  There are no active problems to display for this patient.   History reviewed. No pertinent surgical history.   OB History   None      Home Medications    Prior to Admission medications   Medication Sig Start Date End Date Taking? Authorizing Provider  diclofenac (CATAFLAM) 50 MG tablet Take 1 tablet (50 mg total) by mouth 3 (three) times daily. Patient not taking: Reported on 05/11/2018 03/19/13   Linna HoffKindl, James D, MD  omeprazole (PRILOSEC) 20 MG capsule Take 1 capsule (20 mg total) by mouth daily. 05/11/18   Aide Wojnar, Tinnie GensJeffrey, PA-C  ondansetron (ZOFRAN ODT) 4 MG disintegrating tablet Take 1 tablet (4 mg total) by mouth every 8 (eight) hours as needed for nausea or vomiting. Patient not taking: Reported on 05/11/2018 04/23/18   Cristina GongHammond, Elizabeth W, PA-C  ondansetron (ZOFRAN) 4 MG tablet Take 1 tablet (4 mg total) by mouth every 6 (six) hours. 05/11/18   Keigo Whalley, Tinnie GensJeffrey, PA-C  ranitidine (ZANTAC) 150 MG capsule Take 1 capsule (150 mg total) by mouth daily. 05/11/18   Eyvonne MechanicHedges, Skylyn Slezak, PA-C      Family History History reviewed. No pertinent family history.  Social History Social History   Tobacco Use  . Smoking status: Current Every Day Smoker    Packs/day: 1.00    Types: Cigarettes  Substance Use Topics  . Alcohol use: Yes    Comment: occ  . Drug use: No     Allergies   Patient has no known allergies.   Review of Systems Review of Systems  All other systems reviewed and are negative.    Physical Exam Updated Vital Signs BP 113/73   Pulse 76   Temp 97.8 F (36.6 C) (Oral) Comment: Simultaneous filing. User may not have seen previous data. Comment (Src): Simultaneous filing. User may not have seen previous data.  Resp 16   Ht 5\' 6"  (1.676 m)   Wt 95.3 kg (210 lb)   LMP 05/01/2018 (Approximate)   SpO2 96%   BMI 33.89 kg/m   Physical Exam  Constitutional: She is oriented to person, place, and time. She appears well-developed and well-nourished.  HENT:  Head: Normocephalic and atraumatic.  Eyes: Pupils are equal, round, and reactive to light. Conjunctivae are normal. Right eye exhibits no discharge. Left eye exhibits no discharge. No scleral icterus.  Neck: Normal range of motion. No JVD present. No tracheal deviation present.  Pulmonary/Chest: Effort normal. No stridor.  Abdominal:  Minor tenderness to palpation of the abdomen diffusely, , nonfocal, no rebound guarding or masses  Neurological: She is alert  and oriented to person, place, and time. Coordination normal.  Psychiatric: She has a normal mood and affect. Her behavior is normal. Judgment and thought content normal.  Nursing note and vitals reviewed.   ED Treatments / Results  Labs (all labs ordered are listed, but only abnormal results are displayed) Labs Reviewed  URINALYSIS, ROUTINE W REFLEX MICROSCOPIC - Abnormal; Notable for the following components:      Result Value   Color, Urine AMBER (*)    APPearance CLOUDY (*)    Specific Gravity, Urine 1.032 (*)    Hgb urine dipstick  SMALL (*)    Bilirubin Urine MODERATE (*)    Ketones, ur 80 (*)    Protein, ur 100 (*)    Bacteria, UA MANY (*)    All other components within normal limits  COMPREHENSIVE METABOLIC PANEL  LIPASE, BLOOD  CBC WITH DIFFERENTIAL/PLATELET  I-STAT BETA HCG BLOOD, ED ([facility], [facility], [facility] ONLY)    EKG None  Radiology Ct Abdomen Pelvis W Contrast  Result Date: 05/11/2018 CLINICAL DATA:  Abdominal discomfort, nausea vomiting for 2 weeks. Recent diagnosis of gastroenteritis. EXAM: CT ABDOMEN AND PELVIS WITH CONTRAST TECHNIQUE: Multidetector CT imaging of the abdomen and pelvis was performed using the standard protocol following bolus administration of intravenous contrast. CONTRAST:  OMNIPAQUE IOHEXOL 300 MG/ML  SOLN COMPARISON:  None. FINDINGS: Lower chest: No acute abnormality. Hepatobiliary: No focal liver abnormality is seen. No gallstones, gallbladder wall thickening, or biliary dilatation. Pancreas: Unremarkable. No pancreatic ductal dilatation or surrounding inflammatory changes. Spleen: Normal in size without focal abnormality. Adrenals/Urinary Tract: Adrenal glands are unremarkable. Kidneys are normal, without renal calculi, focal lesion, or hydronephrosis. Bladder is unremarkable. Stomach/Bowel: Stomach is within normal limits. Appendix appears normal. No evidence of bowel wall thickening, distention, or inflammatory changes. Vascular/Lymphatic: No significant vascular findings are present. No enlarged abdominal or pelvic lymph nodes. Shotty, sub pathologic by CT criteria retroperitoneal and mesenteric lymph nodes, predominantly in the right mid abdomen. Reproductive: Uterus and bilateral adnexa are unremarkable. Other: No abdominal wall hernia or abnormality. No abdominopelvic ascites. Musculoskeletal: No acute or significant osseous findings. IMPRESSION: No evidence of acute abnormalities within the solid abdominal organs. Shotty, sub pathologic by CT criteria retroperitoneal and mesenteric lymph  nodes, predominantly in the right mid abdomen, nonspecific. Electronically Signed   By: Ted Mcalpine M.D.   On: 05/11/2018 19:15    Procedures Procedures (including critical care time)  Medications Ordered in ED Medications  ranitidine (ZANTAC) 150 MG/10ML syrup 150 mg (has no administration in time range)  ondansetron (ZOFRAN-ODT) disintegrating tablet 4 mg (has no administration in time range)  gi cocktail (Maalox,Lidocaine,Donnatal) (30 mLs Oral Given 05/11/18 1707)  iohexol (OMNIPAQUE) 300 MG/ML solution 100 mL (100 mLs Intravenous Contrast Given 05/11/18 1832)     Initial Impression / Assessment and Plan / ED Course  I have reviewed the triage vital signs and the nursing notes.  Pertinent labs & imaging results that were available during my care of the patient were reviewed by me and considered in my medical decision making (see chart for details).     Labs: urinalysis, I-STAT beta hCG, CMP, lipase, CBC  Imaging:CT abdomen and pelvis with contrast  Consults:  Therapeutics:  Discharge Meds:   Assessment/Plan: 32 year old female presents today with likely gastritis. Patient well-appearing in no acute distress.she has reassuring workup with no signs of significant infectious etiology. Vision will be treated with antinausea medication, Zantac and Prilosec. She'll follow up as an outpatient for repeat evaluation and  ongoing management. She is given strict return precautions. She had no further questions concerns at time of discharge.    Final Clinical Impressions(s) / ED Diagnoses   Final diagnoses:  Acute gastritis without hemorrhage, unspecified gastritis type    ED Discharge Orders        Ordered    ondansetron (ZOFRAN) 4 MG tablet  Every 6 hours     05/11/18 1942    ranitidine (ZANTAC) 150 MG capsule  Daily     05/11/18 1942    omeprazole (PRILOSEC) 20 MG capsule  Daily     05/11/18 1942       Eyvonne Mechanic, PA-C 05/11/18 1943    Melene Plan,  DO 05/11/18 2023

## 2018-05-11 NOTE — ED Triage Notes (Signed)
Pt endorses intermittent n/v with abd discomfort x 2 weeks. Was seen here 2 weeks ago for same and told she had gastroenteritis. Pt was told to come back if problem persisted for CT scan. Tachy.

## 2018-05-11 NOTE — Discharge Instructions (Signed)
Please read attached information. If you experience any new or worsening signs or symptoms please return to the emergency room for evaluation. Please follow-up with your primary care provider or specialist as discussed. Please use medication prescribed only as directed and discontinue taking if you have any concerning signs or symptoms.   °

## 2018-05-11 NOTE — ED Provider Notes (Signed)
Patient placed in Quick Look pathway, seen and evaluated   Chief Complaint: lower abdominal pain  HPI:   Patient presents today for evaluation of continued abdominal pain.  I personally saw her on 04/23/18 when she was having epigastric pain.  She reports that the pain has continued, now is more mid/lower abdomen than epigastric.  She was given rx for zofran and Prilosec, she says that the Prilosec helped with the epigastric pain   ROS: Constipation, nausea, belching.    Physical Exam:   Gen: No distress  Neuro: Awake and Alert  Skin: Warm    Focused Exam: Abd is soft, generally tender, non distended.     Initiation of care has begun. The patient has been counseled on the process, plan, and necessity for staying for the completion/evaluation, and the remainder of the medical screening examination  Last time I saw patient her symptoms improved with GI cocktail.  Discussed with patient the role of bloodwork and CT scan.  She wishes to have CT scan this time.  CT scan ordered.     Cristina GongHammond, Margaretha Mahan W, PA-C 05/11/18 1645    Charlynne PanderYao, David Hsienta, MD 05/11/18 2350

## 2018-05-25 ENCOUNTER — Encounter: Payer: Self-pay | Admitting: Gastroenterology

## 2018-07-25 ENCOUNTER — Ambulatory Visit: Payer: Self-pay | Admitting: Gastroenterology

## 2018-08-01 IMAGING — CT CT ABD-PELV W/ CM
2 of 4 series · 16 of 46 positions shown, 18 images · IV contrast (omnipaque)
Comparison: None.

CLINICAL DATA: Abdominal discomfort, nausea vomiting for 2 weeks.
Recent diagnosis of gastroenteritis.

EXAM:
CT ABDOMEN AND PELVIS WITH CONTRAST
TECHNIQUE: Multidetector CT imaging of the abdomen and pelvis was performed
using the standard protocol following bolus administration of
intravenous contrast.
CONTRAST:  100mL OMNIPAQUE IOHEXOL 300 MG/ML  SOLN

[Series 3: abdomen 5.0 · axial · 0.79mm/px · z∈[+910,+1375]mm · 13 of 105 slices shown, 15 images]
[im 6/105  soft-tissue]
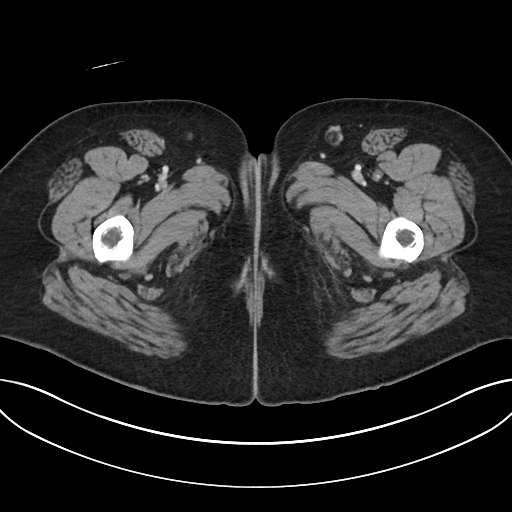
[im 6/105  bone]
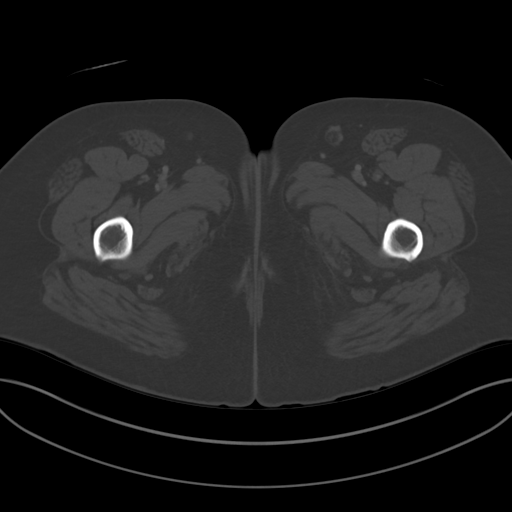
[im 17/105  soft-tissue]
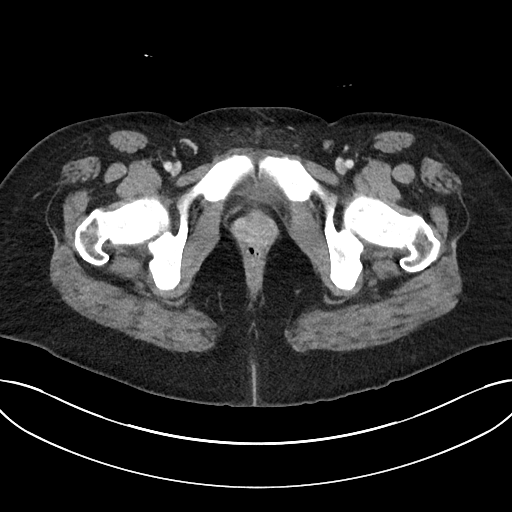
[im 22/105  soft-tissue]
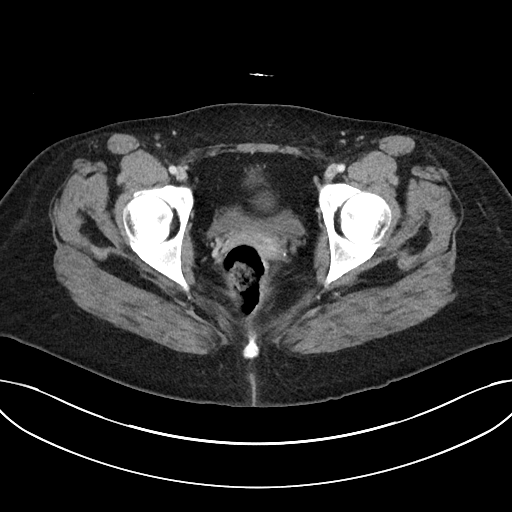
[im 28/105  soft-tissue]
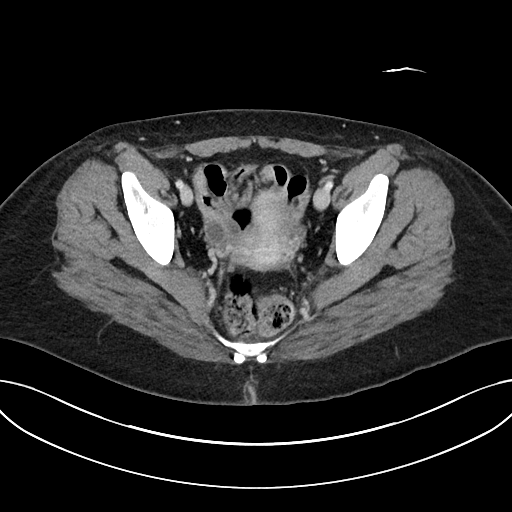
[im 39/105  soft-tissue]
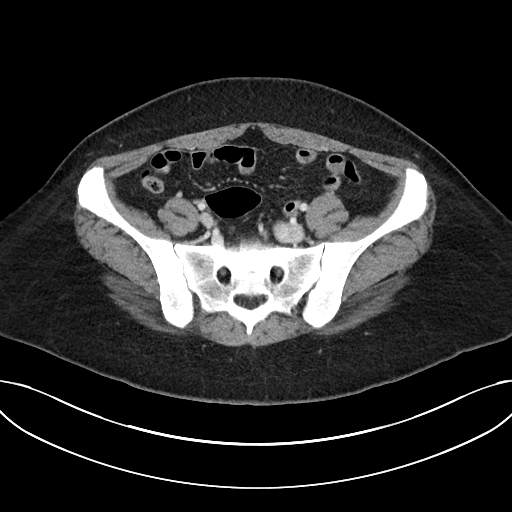
[im 44/105  soft-tissue]
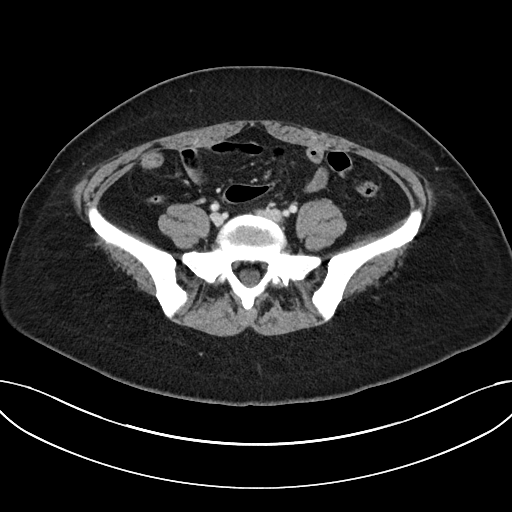
[im 55/105  soft-tissue]
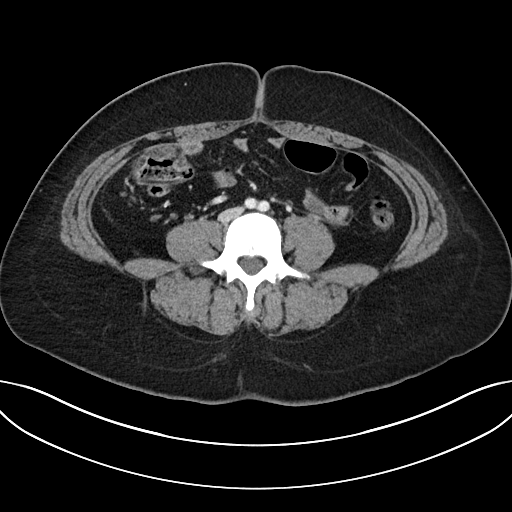
[im 61/105  soft-tissue]
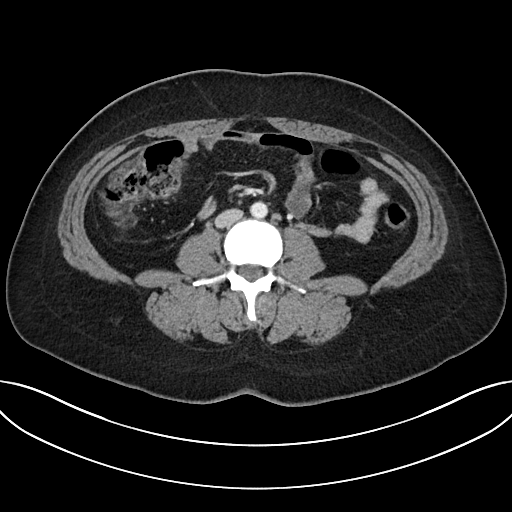
[im 66/105  soft-tissue]
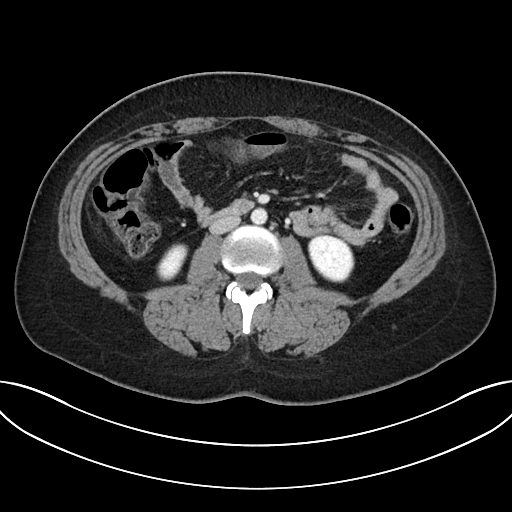
[im 66/105  bone]
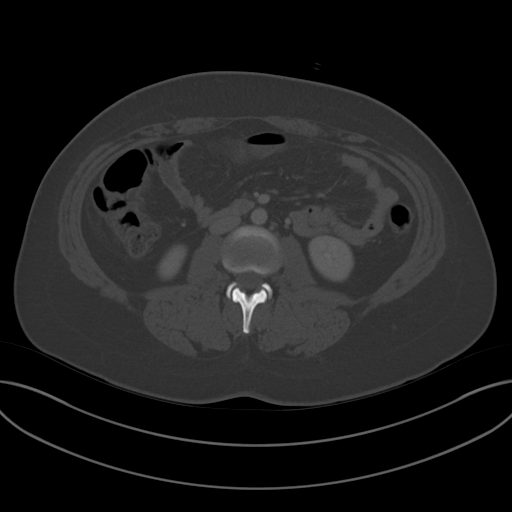
[im 77/105  soft-tissue]
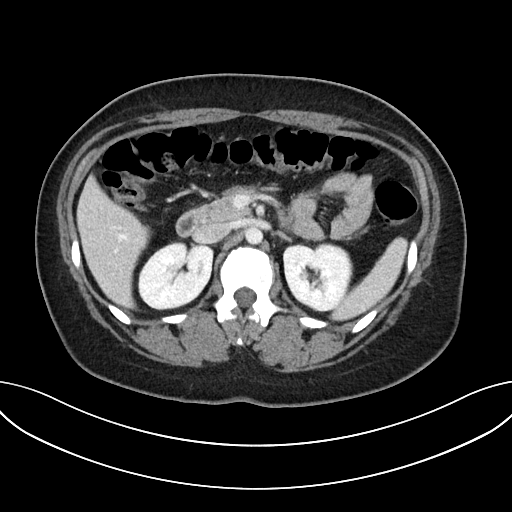
[im 83/105  soft-tissue]
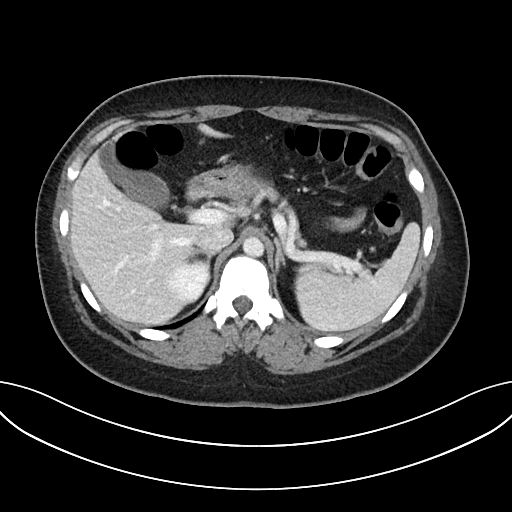
[im 88/105  soft-tissue]
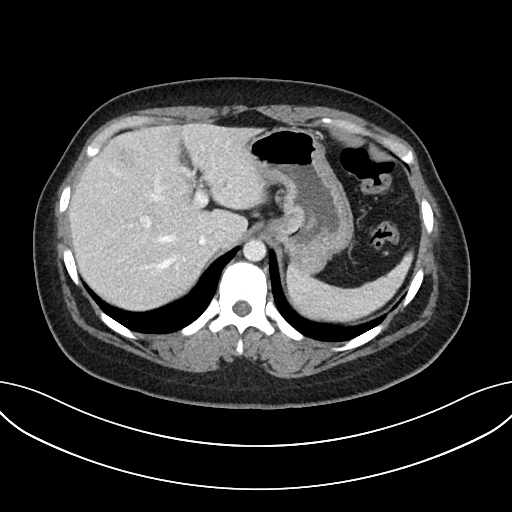
[im 99/105  soft-tissue]
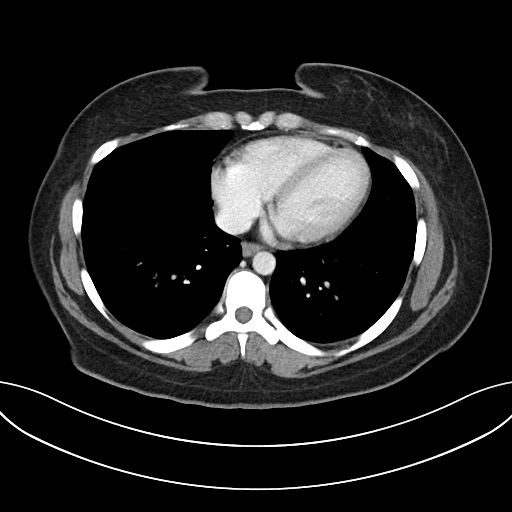

[Series 6: abdomen 3.0 mpr cor · coronal · 0.88mm/px · 3 of 101 slices shown]
[im 34/101  soft-tissue]
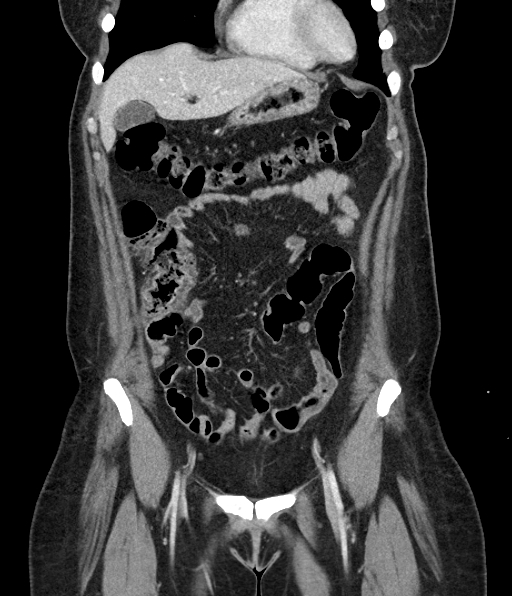
[im 45/101  soft-tissue]
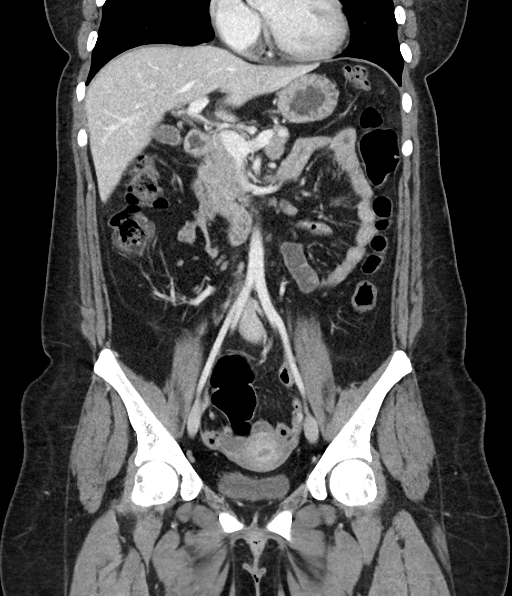
[im 56/101  soft-tissue]
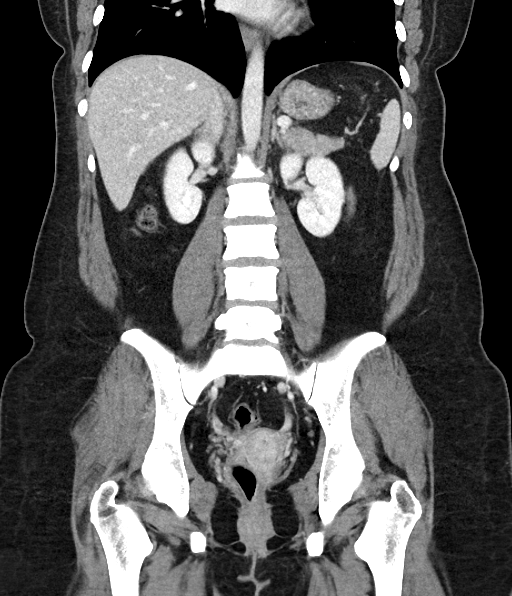

[16 of 46 positions shown; findings below may reference images not displayed]

FINDINGS: Lower chest: No acute abnormality.

Hepatobiliary: No focal liver abnormality is seen. No gallstones,
gallbladder wall thickening, or biliary dilatation.

Pancreas: Unremarkable. No pancreatic ductal dilatation or
surrounding inflammatory changes.

Spleen: Normal in size without focal abnormality.

Adrenals/Urinary Tract: Adrenal glands are unremarkable. Kidneys are
normal, without renal calculi, focal lesion, or hydronephrosis.
Bladder is unremarkable.

Stomach/Bowel: Stomach is within normal limits. Appendix appears
normal. No evidence of bowel wall thickening, distention, or
inflammatory changes.

Vascular/Lymphatic: No significant vascular findings are present. No
enlarged abdominal or pelvic lymph nodes. Shotty, sub pathologic by
CT criteria retroperitoneal and mesenteric lymph nodes,
predominantly in the right mid abdomen.

Reproductive: Uterus and bilateral adnexa are unremarkable.

Other: No abdominal wall hernia or abnormality. No abdominopelvic
ascites.

Musculoskeletal: No acute or significant osseous findings.
IMPRESSION: No evidence of acute abnormalities within the solid abdominal
organs.

Shotty, sub pathologic by CT criteria retroperitoneal and mesenteric
lymph nodes, predominantly in the right mid abdomen, nonspecific.

## 2024-06-09 ENCOUNTER — Other Ambulatory Visit: Payer: Self-pay

## 2024-06-09 ENCOUNTER — Emergency Department (HOSPITAL_COMMUNITY)

## 2024-06-09 ENCOUNTER — Inpatient Hospital Stay (HOSPITAL_COMMUNITY)
Admission: EM | Admit: 2024-06-09 | Discharge: 2024-06-11 | DRG: 494 | Disposition: A | Discharge status: Home / Self Care | Attending: Orthopaedic Surgery | Admitting: Orthopaedic Surgery

## 2024-06-09 ENCOUNTER — Encounter (HOSPITAL_COMMUNITY): Payer: Self-pay

## 2024-06-09 ENCOUNTER — Emergency Department (HOSPITAL_COMMUNITY): Payer: Self-pay

## 2024-06-09 ENCOUNTER — Encounter (HOSPITAL_COMMUNITY)
Admission: EM | Disposition: A | Discharge status: Home / Self Care | Payer: Self-pay | Source: Home / Self Care | Attending: Orthopaedic Surgery

## 2024-06-09 DIAGNOSIS — Z6836 Body mass index (BMI) 36.0-36.9, adult: Secondary | ICD-10-CM

## 2024-06-09 DIAGNOSIS — S0181XA Laceration without foreign body of other part of head, initial encounter: Secondary | ICD-10-CM | POA: Diagnosis present

## 2024-06-09 DIAGNOSIS — S82871B Displaced pilon fracture of right tibia, initial encounter for open fracture type I or II: Secondary | ICD-10-CM | POA: Diagnosis present

## 2024-06-09 DIAGNOSIS — S81012A Laceration without foreign body, left knee, initial encounter: Secondary | ICD-10-CM | POA: Diagnosis present

## 2024-06-09 DIAGNOSIS — I959 Hypotension, unspecified: Secondary | ICD-10-CM | POA: Diagnosis present

## 2024-06-09 DIAGNOSIS — S9304XA Dislocation of right ankle joint, initial encounter: Secondary | ICD-10-CM | POA: Diagnosis present

## 2024-06-09 DIAGNOSIS — Z7982 Long term (current) use of aspirin: Secondary | ICD-10-CM | POA: Diagnosis not present

## 2024-06-09 DIAGNOSIS — Y92410 Unspecified street and highway as the place of occurrence of the external cause: Secondary | ICD-10-CM

## 2024-06-09 DIAGNOSIS — F17200 Nicotine dependence, unspecified, uncomplicated: Secondary | ICD-10-CM | POA: Diagnosis present

## 2024-06-09 DIAGNOSIS — S82891B Other fracture of right lower leg, initial encounter for open fracture type I or II: Secondary | ICD-10-CM

## 2024-06-09 DIAGNOSIS — E66812 Obesity, class 2: Secondary | ICD-10-CM | POA: Diagnosis present

## 2024-06-09 DIAGNOSIS — S8251XB Displaced fracture of medial malleolus of right tibia, initial encounter for open fracture type I or II: Secondary | ICD-10-CM

## 2024-06-09 DIAGNOSIS — S8991XA Unspecified injury of right lower leg, initial encounter: Secondary | ICD-10-CM

## 2024-06-09 DIAGNOSIS — S81002A Unspecified open wound, left knee, initial encounter: Secondary | ICD-10-CM

## 2024-06-09 DIAGNOSIS — S82873A Displaced pilon fracture of unspecified tibia, initial encounter for closed fracture: Secondary | ICD-10-CM | POA: Diagnosis present

## 2024-06-09 HISTORY — PX: ORIF ANKLE FRACTURE: SHX5408

## 2024-06-09 HISTORY — PX: INCISION AND DRAINAGE OF WOUND: SHX1803

## 2024-06-09 LAB — COMPREHENSIVE METABOLIC PANEL WITH GFR
ALT: 22 U/L (ref 0–44)
AST: 52 U/L — ABNORMAL HIGH (ref 15–41)
Albumin: 3.8 g/dL (ref 3.5–5.0)
Alkaline Phosphatase: 88 U/L (ref 38–126)
Anion gap: 12 (ref 5–15)
BUN: 9 mg/dL (ref 6–20)
CO2: 20 mmol/L — ABNORMAL LOW (ref 22–32)
Calcium: 8.7 mg/dL — ABNORMAL LOW (ref 8.9–10.3)
Chloride: 107 mmol/L (ref 98–111)
Creatinine, Ser: 1.21 mg/dL — ABNORMAL HIGH (ref 0.44–1.00)
GFR, Estimated: 60 mL/min — ABNORMAL LOW (ref >60)
Glucose, Bld: 154 mg/dL — ABNORMAL HIGH (ref 70–99)
Potassium: 3.6 mmol/L (ref 3.5–5.1)
Sodium: 139 mmol/L (ref 135–145)
Total Bilirubin: 0.6 mg/dL (ref 0.0–1.2)
Total Protein: 6.6 g/dL (ref 6.5–8.1)

## 2024-06-09 LAB — CBC
HCT: 38.2 % (ref 36.0–46.0)
Hemoglobin: 12.6 g/dL (ref 12.0–15.0)
MCH: 29.9 pg (ref 26.0–34.0)
MCHC: 33 g/dL (ref 30.0–36.0)
MCV: 90.5 fL (ref 80.0–100.0)
Platelets: 392 K/uL (ref 150–400)
RBC: 4.22 MIL/uL (ref 3.87–5.11)
RDW: 12.7 % (ref 11.5–15.5)
WBC: 17 K/uL — ABNORMAL HIGH (ref 4.0–10.5)
nRBC: 0 % (ref 0.0–0.2)

## 2024-06-09 LAB — PROTIME-INR
INR: 1.1 (ref 0.8–1.2)
Prothrombin Time: 14.4 s (ref 11.4–15.2)

## 2024-06-09 LAB — I-STAT CHEM 8, ED
BUN: 8 mg/dL (ref 6–20)
Calcium, Ion: 1.06 mmol/L — ABNORMAL LOW (ref 1.15–1.40)
Chloride: 106 mmol/L (ref 98–111)
Creatinine, Ser: 1.4 mg/dL — ABNORMAL HIGH (ref 0.44–1.00)
Glucose, Bld: 153 mg/dL — ABNORMAL HIGH (ref 70–99)
HCT: 38 % (ref 36.0–46.0)
Hemoglobin: 12.9 g/dL (ref 12.0–15.0)
Potassium: 3.6 mmol/L (ref 3.5–5.1)
Sodium: 142 mmol/L (ref 135–145)
TCO2: 22 mmol/L (ref 22–32)

## 2024-06-09 LAB — I-STAT CG4 LACTIC ACID, ED: Lactic Acid, Venous: 3.5 mmol/L (ref 0.5–1.9)

## 2024-06-09 LAB — TYPE AND SCREEN
ABO/RH(D): O POS
Antibody Screen: NEGATIVE

## 2024-06-09 LAB — SURGICAL PCR SCREEN
MRSA, PCR: NEGATIVE
Staphylococcus aureus: NEGATIVE

## 2024-06-09 LAB — ETHANOL: Alcohol, Ethyl (B): 177 mg/dL — ABNORMAL HIGH (ref <15)

## 2024-06-09 LAB — ABO/RH: ABO/RH(D): O POS

## 2024-06-09 LAB — HCG, SERUM, QUALITATIVE: Preg, Serum: NEGATIVE

## 2024-06-09 SURGERY — OPEN REDUCTION INTERNAL FIXATION (ORIF) ANKLE FRACTURE
Anesthesia: General | Site: Knee | Laterality: Right

## 2024-06-09 MED ORDER — ACETAMINOPHEN 10 MG/ML IV SOLN
INTRAVENOUS | Status: DC | PRN
Start: 2024-06-09 — End: 2024-06-09
  Administered 2024-06-09: 900 mg via INTRAVENOUS

## 2024-06-09 MED ORDER — 0.9 % SODIUM CHLORIDE (POUR BTL) OPTIME
TOPICAL | Status: DC | PRN
Start: 2024-06-09 — End: 2024-06-09
  Administered 2024-06-09: 1000 mL

## 2024-06-09 MED ORDER — TRANEXAMIC ACID-NACL 1000-0.7 MG/100ML-% IV SOLN
INTRAVENOUS | Status: AC
Start: 2024-06-09 — End: 2024-06-09
  Filled 2024-06-09: qty 100

## 2024-06-09 MED ORDER — ACETAMINOPHEN 10 MG/ML IV SOLN
INTRAVENOUS | Status: AC
Start: 2024-06-09 — End: 2024-06-09
  Filled 2024-06-09: qty 100

## 2024-06-09 MED ORDER — CEFAZOLIN SODIUM-DEXTROSE 2-4 GM/100ML-% IV SOLN
INTRAVENOUS | Status: AC
Start: 2024-06-09 — End: 2024-06-09
  Filled 2024-06-09: qty 100

## 2024-06-09 MED ORDER — PHENYLEPHRINE HCL (PRESSORS) 10 MG/ML IV SOLN
INTRAVENOUS | Status: DC | PRN
Start: 2024-06-09 — End: 2024-06-09
  Administered 2024-06-09: 80 ug via INTRAVENOUS

## 2024-06-09 MED ORDER — DEXAMETHASONE SODIUM PHOSPHATE 10 MG/ML IJ SOLN
INTRAMUSCULAR | Status: DC | PRN
  Administered 2024-06-09: 10 mg via INTRAVENOUS

## 2024-06-09 MED ORDER — ROCURONIUM BROMIDE 10 MG/ML (PF) SYRINGE
PREFILLED_SYRINGE | INTRAVENOUS | Status: DC | PRN
  Administered 2024-06-09: 40 mg via INTRAVENOUS

## 2024-06-09 MED ORDER — DEXMEDETOMIDINE HCL IN NACL 80 MCG/20ML IV SOLN
INTRAVENOUS | Status: DC | PRN
  Administered 2024-06-09: 12 ug via INTRAVENOUS

## 2024-06-09 MED ORDER — MIDAZOLAM HCL 2 MG/2ML IJ SOLN
INTRAMUSCULAR | Status: AC
  Filled 2024-06-09: qty 2

## 2024-06-09 MED ORDER — LIDOCAINE 2% (20 MG/ML) 5 ML SYRINGE
INTRAMUSCULAR | Status: DC | PRN
  Administered 2024-06-09: 100 mg via INTRAVENOUS

## 2024-06-09 MED ORDER — OXYCODONE HCL 5 MG/5ML PO SOLN: 5.0000 mg | Freq: Once | ORAL | Status: DC | PRN

## 2024-06-09 MED ORDER — SUCCINYLCHOLINE CHLORIDE 200 MG/10ML IV SOSY
PREFILLED_SYRINGE | INTRAVENOUS | Status: DC | PRN
  Administered 2024-06-09: 100 mg via INTRAVENOUS

## 2024-06-09 MED ORDER — HYDROMORPHONE HCL 1 MG/ML IJ SOLN
0.5000 mg | INTRAMUSCULAR | Status: DC | PRN
  Administered 2024-06-09 – 2024-06-10 (×3): 1 mg via INTRAVENOUS
  Filled 2024-06-09 (×4): qty 1

## 2024-06-09 MED ORDER — TETANUS-DIPHTH-ACELL PERTUSSIS 5-2.5-18.5 LF-MCG/0.5 IM SUSY
0.5000 mL | PREFILLED_SYRINGE | Freq: Once | INTRAMUSCULAR | Status: AC
  Administered 2024-06-09: 0.5 mL via INTRAMUSCULAR
  Filled 2024-06-09: qty 0.5

## 2024-06-09 MED ORDER — CEFAZOLIN SODIUM-DEXTROSE 2-4 GM/100ML-% IV SOLN
2.0000 g | INTRAVENOUS | Status: AC
  Administered 2024-06-09: 2 g via INTRAVENOUS

## 2024-06-09 MED ORDER — ACETAMINOPHEN 325 MG PO TABS: 325.0000 mg | ORAL_TABLET | Freq: Four times a day (QID) | ORAL | Status: DC | PRN

## 2024-06-09 MED ORDER — CEFAZOLIN SODIUM-DEXTROSE 2-4 GM/100ML-% IV SOLN
2.0000 g | Freq: Once | INTRAVENOUS | Status: AC
  Administered 2024-06-09: 2 g via INTRAVENOUS
  Filled 2024-06-09: qty 100

## 2024-06-09 MED ORDER — FENTANYL CITRATE (PF) 100 MCG/2ML IJ SOLN
INTRAMUSCULAR | Status: AC
  Filled 2024-06-09: qty 2

## 2024-06-09 MED ORDER — LIDOCAINE HCL (PF) 1 % IJ SOLN
30.0000 mL | Freq: Once | INTRAMUSCULAR | Status: AC
  Administered 2024-06-09: 30 mL
  Filled 2024-06-09: qty 30

## 2024-06-09 MED ORDER — HYDROMORPHONE HCL 1 MG/ML IJ SOLN: 0.2500 mg | INTRAMUSCULAR | Status: DC | PRN

## 2024-06-09 MED ORDER — ONDANSETRON HCL 4 MG/2ML IJ SOLN: 4.0000 mg | Freq: Four times a day (QID) | INTRAMUSCULAR | Status: DC | PRN

## 2024-06-09 MED ORDER — SUGAMMADEX SODIUM 200 MG/2ML IV SOLN
INTRAVENOUS | Status: DC | PRN
  Administered 2024-06-09: 200 mg via INTRAVENOUS

## 2024-06-09 MED ORDER — FENTANYL CITRATE PF 50 MCG/ML IJ SOSY
50.0000 ug | PREFILLED_SYRINGE | Freq: Once | INTRAMUSCULAR | Status: AC
  Administered 2024-06-09: 50 ug via INTRAVENOUS
  Filled 2024-06-09: qty 1

## 2024-06-09 MED ORDER — OXYCODONE HCL 5 MG PO TABS
5.0000 mg | ORAL_TABLET | ORAL | Status: DC | PRN
  Administered 2024-06-11 (×2): 10 mg via ORAL
  Filled 2024-06-09: qty 2

## 2024-06-09 MED ORDER — TRANEXAMIC ACID-NACL 1000-0.7 MG/100ML-% IV SOLN
1000.0000 mg | INTRAVENOUS | Status: AC
  Administered 2024-06-09: 1000 mg via INTRAVENOUS

## 2024-06-09 MED ORDER — FENTANYL CITRATE (PF) 250 MCG/5ML IJ SOLN
INTRAMUSCULAR | Status: DC | PRN
  Administered 2024-06-09: 50 ug via INTRAVENOUS
  Administered 2024-06-09 (×2): 100 ug via INTRAVENOUS

## 2024-06-09 MED ORDER — VANCOMYCIN HCL 1000 MG IV SOLR
INTRAVENOUS | Status: DC | PRN
  Administered 2024-06-09: 1000 mg via TOPICAL

## 2024-06-09 MED ORDER — OXYCODONE HCL 5 MG PO TABS
10.0000 mg | ORAL_TABLET | ORAL | Status: DC | PRN
  Administered 2024-06-09 – 2024-06-11 (×6): 10 mg via ORAL
  Filled 2024-06-09: qty 3
  Filled 2024-06-09 (×2): qty 2
  Filled 2024-06-09: qty 3
  Filled 2024-06-09 (×2): qty 2

## 2024-06-09 MED ORDER — ORAL CARE MOUTH RINSE: 15.0000 mL | Freq: Once | OROMUCOSAL | Status: DC

## 2024-06-09 MED ORDER — SODIUM CHLORIDE 0.9 % IR SOLN
Status: DC | PRN
  Administered 2024-06-09: 3000 mL

## 2024-06-09 MED ORDER — ONDANSETRON HCL 4 MG/2ML IJ SOLN
INTRAMUSCULAR | Status: DC | PRN
  Administered 2024-06-09: 4 mg via INTRAVENOUS

## 2024-06-09 MED ORDER — ACETAMINOPHEN 10 MG/ML IV SOLN: 1000.0000 mg | Freq: Once | INTRAVENOUS | Status: DC | PRN

## 2024-06-09 MED ORDER — OXYCODONE HCL 5 MG/5ML PO SOLN
ORAL | Status: AC
  Filled 2024-06-09: qty 5

## 2024-06-09 MED ORDER — LACTATED RINGERS IV SOLN: INTRAVENOUS | Status: DC

## 2024-06-09 MED ORDER — PROPOFOL 10 MG/ML IV BOLUS
INTRAVENOUS | Status: AC
  Filled 2024-06-09: qty 20

## 2024-06-09 MED ORDER — SODIUM CHLORIDE 0.9 % IV SOLN: INTRAVENOUS | Status: DC

## 2024-06-09 MED ORDER — IOHEXOL 350 MG/ML SOLN
75.0000 mL | Freq: Once | INTRAVENOUS | Status: AC | PRN
  Administered 2024-06-09: 75 mL via INTRAVENOUS

## 2024-06-09 MED ORDER — CHLORHEXIDINE GLUCONATE 0.12 % MT SOLN: 15.0000 mL | Freq: Once | OROMUCOSAL | Status: DC

## 2024-06-09 MED ORDER — FENTANYL CITRATE (PF) 100 MCG/2ML IJ SOLN: 25.0000 ug | INTRAMUSCULAR | Status: DC | PRN

## 2024-06-09 MED ORDER — HYDROMORPHONE HCL 1 MG/ML IJ SOLN
INTRAMUSCULAR | Status: AC
  Administered 2024-06-09: 1 mg via INTRAVENOUS
  Filled 2024-06-09: qty 1

## 2024-06-09 MED ORDER — VANCOMYCIN HCL 1000 MG IV SOLR
INTRAVENOUS | Status: AC
  Filled 2024-06-09: qty 20

## 2024-06-09 MED ORDER — FENTANYL CITRATE (PF) 250 MCG/5ML IJ SOLN
INTRAMUSCULAR | Status: AC
  Filled 2024-06-09: qty 5

## 2024-06-09 MED ORDER — DOCUSATE SODIUM 100 MG PO CAPS
100.0000 mg | ORAL_CAPSULE | Freq: Two times a day (BID) | ORAL | Status: DC
  Administered 2024-06-09 – 2024-06-11 (×5): 100 mg via ORAL
  Filled 2024-06-09 (×4): qty 1

## 2024-06-09 MED ORDER — CHLORHEXIDINE GLUCONATE 4 % EX SOLN: 60.0000 mL | Freq: Once | CUTANEOUS | Status: DC

## 2024-06-09 MED ORDER — HYDROMORPHONE HCL 1 MG/ML IJ SOLN
1.0000 mg | Freq: Once | INTRAMUSCULAR | Status: AC
  Administered 2024-06-09: 1 mg via INTRAVENOUS

## 2024-06-09 MED ORDER — HYDROMORPHONE HCL 1 MG/ML IJ SOLN
INTRAMUSCULAR | Status: AC
  Filled 2024-06-09: qty 0.5

## 2024-06-09 MED ORDER — ONDANSETRON HCL 4 MG PO TABS: 4.0000 mg | ORAL_TABLET | Freq: Four times a day (QID) | ORAL | Status: DC | PRN

## 2024-06-09 MED ORDER — ALBUMIN HUMAN 5 % IV SOLN: INTRAVENOUS | Status: DC | PRN

## 2024-06-09 MED ORDER — ACETAMINOPHEN 500 MG PO TABS
1000.0000 mg | ORAL_TABLET | Freq: Four times a day (QID) | ORAL | Status: AC
  Administered 2024-06-09 – 2024-06-10 (×3): 1000 mg via ORAL
  Filled 2024-06-09 (×4): qty 2

## 2024-06-09 MED ORDER — DROPERIDOL 2.5 MG/ML IJ SOLN: 0.6250 mg | Freq: Once | INTRAMUSCULAR | Status: DC | PRN

## 2024-06-09 MED ORDER — PROPOFOL 10 MG/ML IV BOLUS
INTRAVENOUS | Status: DC | PRN
  Administered 2024-06-09: 200 mg via INTRAVENOUS

## 2024-06-09 MED ORDER — OXYCODONE HCL 5 MG PO TABS: 5.0000 mg | ORAL_TABLET | Freq: Once | ORAL | Status: DC | PRN

## 2024-06-09 MED ORDER — HYDROMORPHONE HCL 1 MG/ML IJ SOLN
INTRAMUSCULAR | Status: DC | PRN
  Administered 2024-06-09: .5 mg via INTRAVENOUS

## 2024-06-09 SURGICAL SUPPLY — 57 items
BAG COUNTER SPONGE SURGICOUNT (BAG) ×2 IMPLANT
BANDAGE ESMARK 6X9 LF (GAUZE/BANDAGES/DRESSINGS) IMPLANT
BIT 3.5X110 SOLID OVERDRILL (BIT) IMPLANT
BIT DRILL CANN LONG 4.6X220 (DRILL) IMPLANT
BNDG COHESIVE 4X5 TAN STRL LF (GAUZE/BANDAGES/DRESSINGS) ×2 IMPLANT
BNDG COHESIVE 6X5 TAN ST LF (GAUZE/BANDAGES/DRESSINGS) ×2 IMPLANT
BNDG ELASTIC 4INX 5YD STR LF (GAUZE/BANDAGES/DRESSINGS) IMPLANT
BNDG ELASTIC 6INX 5YD STR LF (GAUZE/BANDAGES/DRESSINGS) IMPLANT
BNDG GAUZE DERMACEA FLUFF 4 (GAUZE/BANDAGES/DRESSINGS) ×2 IMPLANT
COVER SURGICAL LIGHT HANDLE (MISCELLANEOUS) ×2 IMPLANT
DRAPE C-ARM 42X120 X-RAY (DRAPES) ×2 IMPLANT
DRAPE C-ARMOR (DRAPES) IMPLANT
DRAPE OEC MINIVIEW 54X84 (DRAPES) IMPLANT
DRAPE U-SHAPE 47X51 STRL (DRAPES) ×2 IMPLANT
DRSG ADAPTIC 3X8 NADH LF (GAUZE/BANDAGES/DRESSINGS) ×2 IMPLANT
DURAPREP 26ML APPLICATOR (WOUND CARE) ×2 IMPLANT
ELECTRODE REM PT RTRN 9FT ADLT (ELECTROSURGICAL) ×2 IMPLANT
FIXATION ZIPTIGHT ANKLE SNDSMS (Ankle) IMPLANT
GAUZE PAD ABD 8X10 STRL (GAUZE/BANDAGES/DRESSINGS) ×2 IMPLANT
GAUZE SPONGE 4X4 12PLY STRL (GAUZE/BANDAGES/DRESSINGS) ×2 IMPLANT
GAUZE XEROFORM 5X9 LF (GAUZE/BANDAGES/DRESSINGS) IMPLANT
GLOVE BIO SURGEON STRL SZ7.5 (GLOVE) ×2 IMPLANT
GLOVE BIOGEL PI IND STRL 6.5 (GLOVE) ×2 IMPLANT
GLOVE BIOGEL PI IND STRL 8 (GLOVE) ×2 IMPLANT
GLOVE ECLIPSE 6.0 STRL STRAW (GLOVE) ×2 IMPLANT
GLOVE INDICATOR 8.0 STRL GRN (GLOVE) ×2 IMPLANT
GOWN STRL REUS W/ TWL LRG LVL3 (GOWN DISPOSABLE) ×2 IMPLANT
GOWN STRL REUS W/ TWL XL LVL3 (GOWN DISPOSABLE) ×6 IMPLANT
KIT BASIN OR (CUSTOM PROCEDURE TRAY) ×2 IMPLANT
KIT TURNOVER KIT B (KITS) ×2 IMPLANT
KWIRE SMOOTH 1.6X150MM (WIRE) IMPLANT
MANIFOLD NEPTUNE II (INSTRUMENTS) ×2 IMPLANT
NS IRRIG 1000ML POUR BTL (IV SOLUTION) ×2 IMPLANT
PACK ORTHO EXTREMITY (CUSTOM PROCEDURE TRAY) ×2 IMPLANT
PAD ARMBOARD POSITIONER FOAM (MISCELLANEOUS) ×4 IMPLANT
PLATE GORILLA 11H TITAN TIB (Plate) IMPLANT
PLATE TIBIA PREC 7H (Plate) IMPLANT
SCREW 3.5X32 NONLOCKING (Screw) IMPLANT
SCREW CANN NL 3.5X40 (Screw) IMPLANT
SCREW LOCK PLATE R3 3.5X14 (Screw) IMPLANT
SCREW LOCK PLATE R3 3.5X24 (Screw) IMPLANT
SCREW N/L PLATE 3.5X42 (Screw) IMPLANT
SCREW NON LOCKING 3.5X14 (Screw) IMPLANT
SCREW PLT 36X3.5X NONLOCK (Screw) IMPLANT
SCREW RE3CON NL 3.5X30 (Screw) IMPLANT
SET CYSTO W/LG BORE CLAMP LF (SET/KITS/TRAYS/PACK) IMPLANT
SPONGE T-LAP 18X18 ~~LOC~~+RFID (SPONGE) IMPLANT
SPONGE T-LAP 18X36 ~~LOC~~+RFID STR (SPONGE) IMPLANT
STAPLER SKIN PROX 35W (STAPLE) IMPLANT
SUCTION TUBE FRAZIER 10FR DISP (SUCTIONS) ×2 IMPLANT
SUT ETHILON 3 0 FSL (SUTURE) ×4 IMPLANT
SUT ETHILON 3 0 PS 1 (SUTURE) IMPLANT
SUT VIC AB 0 CT1 27XBRD ANBCTR (SUTURE) IMPLANT
SUT VIC AB 2-0 CT1 TAPERPNT 27 (SUTURE) ×2 IMPLANT
TOWEL GREEN STERILE (TOWEL DISPOSABLE) ×2 IMPLANT
TOWEL GREEN STERILE FF (TOWEL DISPOSABLE) ×2 IMPLANT
TUBE CONNECTING 12X1/4 (SUCTIONS) ×2 IMPLANT

## 2024-06-09 NOTE — Anesthesia Postprocedure Evaluation (Signed)
 Anesthesia Post Note  Patient: Robin Brandt  Procedure(s) Performed: OPEN REDUCTION INTERNAL FIXATION (ORIF) RIGHT ANKLE FRACTURE (Right: Ankle) IRRIGATION AND DEBRIDEMENT OF LARGE LACERATION LEFT KNEE (Left: Knee)     Patient location during evaluation: PACU Anesthesia Type: General Level of consciousness: awake and alert Pain management: pain level controlled Vital Signs Assessment: post-procedure vital signs reviewed and stable Respiratory status: spontaneous breathing, nonlabored ventilation, respiratory function stable and patient connected to nasal cannula oxygen Cardiovascular status: blood pressure returned to baseline and stable Postop Assessment: no apparent nausea or vomiting Anesthetic complications: no   No notable events documented.  Last Vitals:  Vitals:   06/09/24 1800 06/09/24 2007  BP: 119/76 121/74  Pulse: (!) 105 (!) 106  Resp: 17 16  Temp: 37.1 C 36.9 C  SpO2: 99% 94%    Last Pain:  Vitals:   06/09/24 1949  TempSrc:   PainSc: 10-Worst pain ever                 Thom JONELLE Peoples
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
## 2024-06-09 NOTE — Progress Notes (Signed)
 Orthopedic Tech Progress Note Patient Details:  Robin Brandt [birth year] [id number]  Ortho Devices Type of Ortho Device: Post (short leg) splint, Stirrup splint Ortho Device/Splint Location: rle Ortho Device/Splint Interventions: Ordered, Application, Adjustment  During the trauma page the dr assisted with splint application post reduction. Post Interventions Patient Tolerated: Well Instructions Provided: Care of device, Adjustment of device  [name] PARAS 06/09/2024, 6:31 AM

## 2024-06-09 NOTE — Transfer of Care (Signed)
 Immediate Anesthesia Transfer of Care Note  Patient: Robin Brandt  Procedure(s) Performed: OPEN REDUCTION INTERNAL FIXATION (ORIF) RIGHT ANKLE FRACTURE (Right: Ankle) IRRIGATION AND DEBRIDEMENT OF LARGE LACERATION LEFT KNEE (Left: Knee)  Patient Location: PACU  Anesthesia Type:General  Level of Consciousness: awake and sedated  Airway & Oxygen Therapy: Patient Spontanous Breathing and Patient connected to face mask oxygen  Post-op Assessment: Report given to RN and Post -op Vital signs reviewed and stable  Post vital signs: Reviewed and stable  Last Vitals:  Vitals Value Taken Time  BP 124/83 06/09/24 13:02  Temp 37.4 C 06/09/24 13:02  Pulse 98 06/09/24 13:08  Resp 14 06/09/24 13:08  SpO2 98 % 06/09/24 13:08  Vitals shown include unfiled device data.  Last Pain:  Vitals:   06/09/24 1302  PainSc: Asleep         Complications: No notable events documented.

## 2024-06-09 NOTE — Brief Op Note (Signed)
   Brief Op Note  Date of Surgery: 06/09/2024  Preoperative Diagnosis: Right Open Ankle Tibial Plafond Fracture Large Laceration Left Knee  Postoperative Diagnosis: same  Procedure: Procedure(s): OPEN REDUCTION INTERNAL FIXATION (ORIF) RIGHT ANKLE FRACTURE IRRIGATION AND DEBRIDEMENT OF LARGE LACERATION LEFT KNEE  Implants: Implant Name Type Inv. Item Serial No. Manufacturer Lot No. LRB No. Used Action  11 hole anterior lateral tibia plate right    [employer]  Right 1 Implanted  SCREW CANN NL 3.5X40 - [id number] Screw SCREW CANN NL 3.5X40  [employer]  Right 2 Implanted  SCREW RE3CON NL 3.5X30 - [id number] Screw SCREW RE3CON NL 3.5X30  [employer]  Right 1 Implanted  SCREW 3.5X32 NONLOCKING - [id number] Screw SCREW 3.5X32 NONLOCKING  [employer]  Right 2 Implanted  SCREW PLT 36X3.5X NONLOCK - [id number] Screw SCREW PLT 36X3.5X NONLOCK  [employer]  Right 3 Implanted  SCREW LOCK PLATE R3 6.4K85 - [id number] Screw SCREW LOCK PLATE R3 6.4K85  [employer]  Right 2 Implanted  SCREW LOCK PLATE R3 6.4K75 - [id number] Screw SCREW LOCK PLATE R3 6.4K75  [employer]  Right 1 Implanted  PLATE TIBIA PREC 7H - [id number] Plate PLATE TIBIA PREC 7H  [employer]  Right 1 Implanted    Surgeons: Surgeon(s): Robin Standing, MD  Anesthesia: General    Estimated Blood Loss: See anesthesia record  Complications: None  Condition to PACU: Stable  [name], MD 06/09/2024 1:27 PM

## 2024-06-09 NOTE — Progress Notes (Signed)
 Orthopedic Tech Progress Note Patient Details:  Robin Brandt 1986-03-16 [id number] Medium CAM boot was delivered to the OR desk  Ortho Devices Type of Ortho Device: CAM walker Ortho Device/Splint Location: rle Ortho Device/Splint Interventions: Ordered   Post Interventions Patient Tolerated: Well Instructions Provided: Care of device, Adjustment of device  Sabrine Patchen E Doren Kaspar 06/09/2024, 12:28 PM
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
## 2024-06-09 NOTE — Progress Notes (Signed)
   06/09/24 0535  Spiritual Encounters  Type of Visit Initial;Attempt (pt unavailable)  Reason for visit Trauma  OnCall Visit Yes  Spiritual Framework  Patient Stress Factors None identified  Family Stress Factors None identified   Upon arrival, Pt was being evaluated by the medical  staff. The Pt was then taken for imaging CT scan for further evaluation. Chaplain services remain available as needed.

## 2024-06-09 NOTE — Discharge Instructions (Signed)
 Discharge Instructions    Attending Surgeon: Elspeth Parker, MD Office Phone Number: [phone]   Diagnosis and Procedures:    Surgeries Performed: Right ankle pilon fixation  Discharge Plan:    Diet: Resume usual diet. Begin with light or bland foods.  Drink plenty of fluids.  Activity:  Non weight bearing right leg. You are advised to go home directly from the hospital or surgical center. Restrict your activities.  GENERAL INSTRUCTIONS: 1.  Please apply ice to your wound to help with swelling and inflammation. This will improve your comfort and your overall recovery following surgery.     2. Please call Dr. Danetta office at [phone] with questions Monday-Friday during business hours. If no one answers, please leave a message and someone should get back to the patient within 24 hours. For emergencies please call 911 or proceed to the emergency room.   3. Patient to notify surgical team if experiences any of the following: Bowel/Bladder dysfunction, uncontrolled pain, nerve/muscle weakness, incision with increased drainage or redness, nausea/vomiting and Fever greater than 101.0 F.  Be alert for signs of infection including redness, streaking, odor, fever or chills. Be alert for excessive pain or bleeding and notify your surgeon immediately.  WOUND INSTRUCTIONS:   Leave your dressing, cast, or splint in place until your post operative visit.  Keep it clean and dry.  Always keep the incision clean and dry until the staples/sutures are removed. If there is no drainage from the incision you should keep it open to air. If there is drainage from the incision you must keep it covered at all times until the drainage stops  Do not soak in a bath tub, hot tub, pool, lake or other body of water until 21 days after your surgery and your incision is completely dry and healed.  If you have removable sutures (or staples) they must be removed 10-14 days (unless otherwise  instructed) from the day of your surgery.     1)  Elevate the extremity as much as possible.  2)  Keep the dressing clean and dry.  3)  Please call us  if the dressing becomes wet or dirty.  4)  If you are experiencing worsening pain or worsening swelling, please call.     MEDICATIONS: Resume all previous home medications at the previous prescribed dose and frequency unless otherwise noted Start taking the  pain medications on an as-needed basis as prescribed  Please taper down pain medication over the next week following surgery.  Ideally you should not require a refill of any narcotic pain medication.  Take pain medication with food to minimize nausea. In addition to the prescribed pain medication, you may take over-the-counter pain relievers such as Tylenol .  Do NOT take additional tylenol  if your pain medication already has tylenol  in it.  Aspirin  325mg  daily per instructions on bottle. Narcotic policy: Per [facility] clinic policy, our goal is ensure optimal postoperative pain control with a multimodal pain management strategy. For all [facility] patients, our goal is to wean post-operative narcotic medications by 6 weeks post-operatively, and many times sooner. If this is not possible due to utilization of pain medication prior to surgery, your [facility] doctor will support your acute post-operative pain control for the first 6 weeks postoperatively, with a plan to transition you back to your primary pain team following that. Maralee will work to ensure a Therapist, occupational.       FOLLOWUP INSTRUCTIONS: 1. Follow up at the Physical Therapy Clinic  3-4 days following surgery. This appointment should be scheduled unless other arrangements have been made.The Physical Therapy scheduling number is [phone] if an appointment has not already been arranged.  2. Contact Dr. Danetta office during office hours at [phone] or the practice after hours line at [phone] for non-emergencies.  For medical emergencies call 911.   Discharge Location: Home
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
## 2024-06-09 NOTE — Anesthesia Procedure Notes (Addendum)
 Procedure Name: Intubation Date/Time: 06/09/2024 10:32 AM  Performed by: Delores Dus, CRNAPre-anesthesia Checklist: Patient identified, Emergency Drugs available, Suction available and Patient being monitored Patient Re-evaluated:Patient Re-evaluated prior to induction Oxygen Delivery Method: Circle system utilized Preoxygenation: Pre-oxygenation with 100% oxygen Induction Type: IV induction and Rapid sequence Laryngoscope Size: Glidescope and 3 Grade View: Grade II Tube type: Oral Tube size: 7.0 mm Number of attempts: 1 Airway Equipment and Method: Stylet and Oral airway Placement Confirmation: ETT inserted through vocal cords under direct vision, positive ETCO2 and breath sounds checked- equal and bilateral Secured at: 24 cm Tube secured with: Tape Dental Injury: Teeth and Oropharynx as per pre-operative assessment
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
## 2024-06-10 ENCOUNTER — Inpatient Hospital Stay (HOSPITAL_COMMUNITY)

## 2024-06-10 MED ORDER — OXYCODONE HCL 5 MG PO TABS
5.0000 mg | ORAL_TABLET | ORAL | 0 refills | Status: AC | PRN
  Filled 2024-06-10: qty 20, 4d supply, fill #0

## 2024-06-10 MED ORDER — IBUPROFEN 800 MG PO TABS
800.0000 mg | ORAL_TABLET | Freq: Three times a day (TID) | ORAL | 0 refills | Status: AC
  Filled 2024-06-10: qty 30, 10d supply, fill #0

## 2024-06-10 MED ORDER — KETOROLAC TROMETHAMINE 15 MG/ML IJ SOLN
15.0000 mg | Freq: Four times a day (QID) | INTRAMUSCULAR | Status: DC | PRN
  Administered 2024-06-10 – 2024-06-11 (×4): 15 mg via INTRAVENOUS
  Filled 2024-06-10 (×3): qty 1

## 2024-06-10 MED ORDER — ACETAMINOPHEN 500 MG PO TABS
500.0000 mg | ORAL_TABLET | Freq: Three times a day (TID) | ORAL | 0 refills | Status: AC
  Filled 2024-06-10: qty 30, 10d supply, fill #0

## 2024-06-10 MED ORDER — ASPIRIN 325 MG PO TBEC
325.0000 mg | DELAYED_RELEASE_TABLET | Freq: Every day | ORAL | 0 refills | Status: AC
  Filled 2024-06-10: qty 14, 14d supply, fill #0

## 2024-06-10 NOTE — Progress Notes (Signed)
  TRN rounded on pt, she is resting comfortably in the bed, eating her lunch.  Pt states that she is having some pain currently but her primary RN is grabbing her medication as we speak.  TRN completed CAGE AID and ITSS.  Pt has great family and friend support.  Pt is complaining of some L wrist pain.  Ordered plain films of L FA and L wrist to r/o fracture.  Pt very pleasant and appreciative of myself checking on her.   Last imported Vital Signs BP (!) 108/59 (BP Location: Right Arm)   Pulse 99   Temp 98.2 F (36.8 C) (Oral)   Resp 15   Ht 5' 6 (1.676 m)   Wt 222 lb (100.7 kg)   SpO2 98%   BMI 35.83 kg/m   Trending CBC Recent Labs    06/09/24 0515 06/09/24 0528  WBC 17.0*  --   HGB 12.6 12.9  HCT 38.2 38.0  PLT 392  --     Trending Coag's Recent Labs    06/09/24 0515  INR 1.1    Trending BMET Recent Labs    06/09/24 0515 06/09/24 0528  NA 139 142  K 3.6 3.6  CL 107 106  CO2 20*  --   BUN 9 8  CREATININE 1.21* 1.40*  GLUCOSE 154* 153*   Bernece Gall W  Trauma Response RN  Please call TRN at [phone] for further assistance.

## 2024-06-10 NOTE — Progress Notes (Signed)
 Patient was complaining of left wrist pain during therapy, MD notified and per MD, Genelle verbal order to get an xray of left wrist.
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
## 2024-06-11 ENCOUNTER — Encounter (HOSPITAL_BASED_OUTPATIENT_CLINIC_OR_DEPARTMENT_OTHER): Payer: Self-pay | Admitting: Orthopaedic Surgery

## 2024-06-11 ENCOUNTER — Other Ambulatory Visit (HOSPITAL_COMMUNITY): Payer: Self-pay

## 2024-06-11 NOTE — Plan of Care (Signed)
  Problem: Education: Goal: Knowledge of General Education information will improve Description: Including pain rating scale, medication(s)/side effects and non-pharmacologic comfort measures Outcome: Progressing   Problem: Clinical Measurements: Goal: Ability to maintain clinical measurements within normal limits will improve Outcome: Progressing Goal: Respiratory complications will improve Outcome: Progressing Goal: Cardiovascular complication will be avoided Outcome: Progressing   Problem: Elimination: Goal: Will not experience complications related to urinary retention Outcome: Progressing   Problem: Activity: Goal: Risk for activity intolerance will decrease Outcome: Not Progressing   Problem: Pain Managment: Goal: General experience of comfort will improve and/or be controlled Outcome: Not Progressing
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
## 2024-06-11 NOTE — Plan of Care (Signed)
 Pt waiting on rwalker for discharge  Problem: Education: Goal: Knowledge of General Education information will improve Description: Including pain rating scale, medication(s)/side effects and non-pharmacologic comfort measures Outcome: Progressing   Problem: Health Behavior/Discharge Planning: Goal: Ability to manage health-related needs will improve Outcome: Progressing   Problem: Clinical Measurements: Goal: Ability to maintain clinical measurements within normal limits will improve Outcome: Progressing Goal: Will remain free from infection Outcome: Progressing Goal: Diagnostic test results will improve Outcome: Progressing Goal: Respiratory complications will improve Outcome: Progressing Goal: Cardiovascular complication will be avoided Outcome: Progressing   Problem: Activity: Goal: Risk for activity intolerance will decrease Outcome: Progressing   Problem: Nutrition: Goal: Adequate nutrition will be maintained Outcome: Progressing   Problem: Pain Managment: Goal: General experience of comfort will improve and/or be controlled Outcome: Progressing   Problem: Skin Integrity: Goal: Risk for impaired skin integrity will decrease Outcome: Progressing

## 2024-06-11 NOTE — Progress Notes (Signed)
 DC order noted per MD. DC RN and primary RN at bedside. Pending afternoon PT prior to discharge. TOC meds picked up/delivered to bedside with the patient/primary RN.
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
## 2024-06-11 NOTE — Progress Notes (Signed)
 Orthopedic Tech Progress Note Patient Details:  [name] 17-Nov-1985 [id number]  Ortho Devices Type of Ortho Device: Velcro wrist splint Ortho Device/Splint Location: LUE Ortho Device/Splint Interventions: Ordered, Application, Adjustment   Post Interventions Patient Tolerated: Fair Instructions Provided: Care of device  [name] Pac 06/11/2024, 10:51 AM

## 2024-06-11 NOTE — Progress Notes (Signed)
   Subjective:  Patient reports pain as mild.  Worked with physical therapy.  Is having some tenderness of the wrist when putting full weight on it  Objective:   VITALS:   Vitals:   06/10/24 1955 06/10/24 2021 06/11/24 0414 06/11/24 0835  BP: 110/67  108/64 124/70  Pulse: (!) 109 85 90 (!) 108  Resp: 13  14 16   Temp: 100.2 F (37.9 C)  98.8 F (37.1 C) 99.1 F (37.3 C)  TempSrc:      SpO2: 100%  96% 99%  Weight:      Height:        Toes are all WWP. Fires EHL/TA/GS bilat. Boot in place  BLE dressing are CDI  Lab Results  Component Value Date   WBC 17.0 (H) 06/09/2024   HGB 12.9 06/09/2024   HCT 38.0 06/09/2024   MCV 90.5 06/09/2024   PLT 392 06/09/2024     Assessment/Plan:  2 Days Post-Op right pilon fixation, left knee wound closure.  Her left wrist does show evidence of an SL injury.  At this time we will plan for a wrist splint to allow for mobilization.  - Patient to work with PTto optimize mobilization safely - DVT ppx - SCDs, ambulation, Aspirin  325 daily - Postoperative Abx: Ancef  x 2 additional doses given - NWB operative right extremity, WBAT left extremity - Pain control - multimodal pain management, ATC acetaminophen  in conjunction with as needed narcotic (oxycodone ), although this should be minimized with other modalities  - Discharge planning pending PT eval, plan for home today following additional physical therapy session   Regan Mcbryar 06/11/2024, 8:44 AM

## 2024-06-12 ENCOUNTER — Encounter (HOSPITAL_COMMUNITY): Payer: Self-pay | Admitting: Orthopaedic Surgery

## 2024-06-15 ENCOUNTER — Other Ambulatory Visit (HOSPITAL_BASED_OUTPATIENT_CLINIC_OR_DEPARTMENT_OTHER): Payer: Self-pay | Admitting: Orthopaedic Surgery

## 2024-06-15 DIAGNOSIS — S82891B Other fracture of right lower leg, initial encounter for open fracture type I or II: Secondary | ICD-10-CM

## 2024-06-20 ENCOUNTER — Ambulatory Visit: Attending: Orthopaedic Surgery

## 2024-06-20 DIAGNOSIS — S82891B Other fracture of right lower leg, initial encounter for open fracture type I or II: Secondary | ICD-10-CM | POA: Diagnosis not present

## 2024-06-20 DIAGNOSIS — R262 Difficulty in walking, not elsewhere classified: Secondary | ICD-10-CM | POA: Insufficient documentation

## 2024-06-20 DIAGNOSIS — M25571 Pain in right ankle and joints of right foot: Secondary | ICD-10-CM | POA: Insufficient documentation

## 2024-06-20 DIAGNOSIS — M6281 Muscle weakness (generalized): Secondary | ICD-10-CM | POA: Insufficient documentation
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
## 2024-06-21 ENCOUNTER — Ambulatory Visit (HOSPITAL_BASED_OUTPATIENT_CLINIC_OR_DEPARTMENT_OTHER): Admitting: Orthopaedic Surgery

## 2024-06-21 ENCOUNTER — Other Ambulatory Visit: Payer: Self-pay

## 2024-06-21 ENCOUNTER — Ambulatory Visit (HOSPITAL_BASED_OUTPATIENT_CLINIC_OR_DEPARTMENT_OTHER)

## 2024-06-21 DIAGNOSIS — S82891B Other fracture of right lower leg, initial encounter for open fracture type I or II: Secondary | ICD-10-CM | POA: Diagnosis not present

## 2024-06-21 DIAGNOSIS — M25532 Pain in left wrist: Secondary | ICD-10-CM | POA: Diagnosis not present

## 2024-06-21 NOTE — Progress Notes (Signed)
 Post Operative Evaluation    Procedure/Date of Surgery: Right pilon fixation 8/9  Interval History:   Presents 2 weeks status post above procedure.  She is here today for wound check.  Her wound is healing nicely.  She has been compliant with nonweightbearing.  She is still having some left wrist pain   PMH/PSH/Family History/Social History/Meds/Allergies:   No past medical history on file. Past Surgical History:  Procedure Laterality Date   INCISION AND DRAINAGE OF WOUND Left 06/09/2024   Procedure: IRRIGATION AND DEBRIDEMENT OF LARGE LACERATION LEFT KNEE;  Surgeon: Genelle Standing, Robin Brandt;  Location: [facility] OR;  Service: Orthopedics;  Laterality: Left;   ORIF ANKLE FRACTURE Right 06/09/2024   Procedure: OPEN REDUCTION INTERNAL FIXATION (ORIF) RIGHT ANKLE FRACTURE;  Surgeon: Genelle Standing, Robin Brandt;  Location: [facility] OR;  Service: Orthopedics;  Laterality: Right;   Social History   Socioeconomic History   Marital status: Single    Spouse name: Not on file   Number of children: Not on file   Years of education: Not on file   Highest education level: Not on file  Occupational History   Not on file  Tobacco Use   Smoking status: Some Days    Types: Cigarettes   Smokeless tobacco: Never  Vaping Use   Vaping status: Every Day  Substance and Sexual Activity   Alcohol use: Yes    Comment: occ   Drug use: No   Sexual activity: Not on file  Other Topics Concern   Not on file  Social History Narrative   ** Merged History Encounter **       Social Drivers of Health   Financial Resource Strain: Not on file  Food Insecurity: No Food Insecurity (06/09/2024)   Hunger Vital Sign    Worried About Running Out of Food in the Last Year: Never true    Ran Out of Food in the Last Year: Never true  Transportation Needs: No Transportation Needs (06/09/2024)   PRAPARE - Administrator, Civil Service (Medical): No    Lack of Transportation (Non-Medical): No   Physical Activity: Not on file  Stress: Not on file  Social Connections: Not on file   No family history on file. No Known Allergies Current Outpatient Medications  Medication Sig Dispense Refill   acetaminophen  (TYLENOL ) 500 MG tablet Take 1 tablet (500 mg total) by mouth every 8 (eight) hours for 10 days. 30 tablet 0   aspirin  EC 325 MG tablet Take 1 tablet (325 mg total) by mouth daily. 14 tablet 0   Cholecalciferol (VITAMIN D3) 50 MCG (2000 UT) TABS Take 1 tablet by mouth daily.     diclofenac  (CATAFLAM ) 50 MG tablet Take 1 tablet (50 mg total) by mouth 3 (three) times daily. (Patient not taking: Reported on 05/11/2018) 21 tablet 0   diphenhydrAMINE HCl (BENADRYL ALLERGY PO) Take 0.5-1 tablets by mouth as needed.     ibuprofen  (ADVIL ) 800 MG tablet Take 1 tablet (800 mg total) by mouth every 8 (eight) hours for 10 days. Please take with food, please alternate with acetaminophen  30 tablet 0   omeprazole  (PRILOSEC) 20 MG capsule Take 1 capsule (20 mg total) by mouth daily. 30 capsule 0   ondansetron  (ZOFRAN  ODT) 4 MG disintegrating tablet Take 1 tablet (4 mg total) by mouth every  8 (eight) hours as needed for nausea or vomiting. (Patient not taking: Reported on 05/11/2018) 10 tablet 0   ondansetron  (ZOFRAN ) 4 MG tablet Take 1 tablet (4 mg total) by mouth every 6 (six) hours. 12 tablet 0   oxyCODONE  (ROXICODONE ) 5 MG immediate release tablet Take 1 tablet (5 mg total) by mouth every 4 (four) hours as needed for severe pain (pain score 7-10) or breakthrough pain. 20 tablet 0   ranitidine  (ZANTAC ) 150 MG capsule Take 1 capsule (150 mg total) by mouth daily. 30 capsule 0   No current facility-administered medications for this visit.   No results found.  Review of Systems:   A ROS was performed including pertinent positives and negatives as documented in the HPI.   Musculoskeletal Exam:    There were no vitals taken for this visit.  Right incision is well-appearing without erythema or  drainage.  There are some swelling about the foot with warm well-perfused toes.  Fires EHL as well as tibialis anterior gastrosoleus.  Left wrist with tenderness about the scapholunate joint.  Imaging:    3 views right ankle, 3 views left wrist: There appears to be a scapholunate injury of the left wrist with widening, status post right ankle pilon fixation without complication  I personally reviewed and interpreted the radiographs.   Assessment:   2 weeks status post right pilon open reduction internal fixation doing well.  This time she will continue nonweightbearing.  I will plan to see her back in 4 weeks for reassessment.  I will plan to have her follow-up with Dr. Erwin for discussion of her left wrist SL injury.  She will be back in 1 week for remainder of suture removal  Plan :    - Return to clinic 4 weeks for reassessment      I personally saw and evaluated the patient, and participated in the management and treatment plan.  Robin Parker, Robin Brandt Attending Physician, Orthopedic Surgery  This document was dictated using Dragon voice recognition software. A reasonable attempt at proof reading has been made to minimize errors.
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
## 2024-06-27 ENCOUNTER — Ambulatory Visit

## 2024-06-27 DIAGNOSIS — M25571 Pain in right ankle and joints of right foot: Secondary | ICD-10-CM | POA: Diagnosis not present

## 2024-06-27 DIAGNOSIS — R262 Difficulty in walking, not elsewhere classified: Secondary | ICD-10-CM

## 2024-06-27 DIAGNOSIS — M6281 Muscle weakness (generalized): Secondary | ICD-10-CM

## 2024-07-03 ENCOUNTER — Ambulatory Visit (HOSPITAL_BASED_OUTPATIENT_CLINIC_OR_DEPARTMENT_OTHER): Payer: Self-pay | Admitting: Orthopaedic Surgery
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
## 2024-07-04 ENCOUNTER — Ambulatory Visit: Attending: Orthopaedic Surgery

## 2024-07-04 DIAGNOSIS — R262 Difficulty in walking, not elsewhere classified: Secondary | ICD-10-CM | POA: Insufficient documentation

## 2024-07-04 DIAGNOSIS — M6281 Muscle weakness (generalized): Secondary | ICD-10-CM | POA: Insufficient documentation

## 2024-07-04 DIAGNOSIS — M25571 Pain in right ankle and joints of right foot: Secondary | ICD-10-CM | POA: Insufficient documentation

## 2024-07-09 ENCOUNTER — Encounter (HOSPITAL_BASED_OUTPATIENT_CLINIC_OR_DEPARTMENT_OTHER): Payer: Self-pay

## 2024-07-09 ENCOUNTER — Ambulatory Visit (HOSPITAL_BASED_OUTPATIENT_CLINIC_OR_DEPARTMENT_OTHER): Admitting: Orthopaedic Surgery

## 2024-07-11 ENCOUNTER — Encounter: Payer: Self-pay | Admitting: Physical Therapy

## 2024-07-11 ENCOUNTER — Ambulatory Visit: Admitting: Physical Therapy

## 2024-07-11 DIAGNOSIS — M6281 Muscle weakness (generalized): Secondary | ICD-10-CM

## 2024-07-11 DIAGNOSIS — M25571 Pain in right ankle and joints of right foot: Secondary | ICD-10-CM

## 2024-07-11 DIAGNOSIS — R262 Difficulty in walking, not elsewhere classified: Secondary | ICD-10-CM
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
## 2024-07-12 ENCOUNTER — Encounter (HOSPITAL_BASED_OUTPATIENT_CLINIC_OR_DEPARTMENT_OTHER): Payer: Self-pay | Admitting: Orthopaedic Surgery

## 2024-07-16 ENCOUNTER — Ambulatory Visit: Admitting: Physical Therapy

## 2024-07-16 ENCOUNTER — Encounter: Admitting: Orthopedic Surgery

## 2024-07-16 DIAGNOSIS — M6281 Muscle weakness (generalized): Secondary | ICD-10-CM

## 2024-07-16 DIAGNOSIS — R262 Difficulty in walking, not elsewhere classified: Secondary | ICD-10-CM

## 2024-07-16 DIAGNOSIS — M25571 Pain in right ankle and joints of right foot: Secondary | ICD-10-CM
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
## 2024-07-19 ENCOUNTER — Ambulatory Visit (INDEPENDENT_AMBULATORY_CARE_PROVIDER_SITE_OTHER)

## 2024-07-19 ENCOUNTER — Ambulatory Visit (INDEPENDENT_AMBULATORY_CARE_PROVIDER_SITE_OTHER): Admitting: Orthopaedic Surgery

## 2024-07-19 DIAGNOSIS — S82891B Other fracture of right lower leg, initial encounter for open fracture type I or II: Secondary | ICD-10-CM

## 2024-07-19 NOTE — Progress Notes (Signed)
 Post Operative Evaluation    Procedure/Date of Surgery: Right pilon fixation 8/9  Interval History:   Presents 6 weeks status post the above procedure.  At this time she has been putting some weight on the ankle which is going well.  She is overall doing quite well with physical therapy   PMH/PSH/Family History/Social History/Meds/Allergies:   No past medical history on file. Past Surgical History:  Procedure Laterality Date   INCISION AND DRAINAGE OF WOUND Left 06/09/2024   Procedure: IRRIGATION AND DEBRIDEMENT OF LARGE LACERATION LEFT KNEE;  Surgeon: Genelle Standing, MD;  Location: [facility] OR;  Service: Orthopedics;  Laterality: Left;   ORIF ANKLE FRACTURE Right 06/09/2024   Procedure: OPEN REDUCTION INTERNAL FIXATION (ORIF) RIGHT ANKLE FRACTURE;  Surgeon: Genelle Standing, MD;  Location: [facility] OR;  Service: Orthopedics;  Laterality: Right;   Social History   Socioeconomic History   Marital status: Single    Spouse name: Not on file   Number of children: Not on file   Years of education: Not on file   Highest education level: Not on file  Occupational History   Not on file  Tobacco Use   Smoking status: Some Days    Types: Cigarettes   Smokeless tobacco: Never  Vaping Use   Vaping status: Every Day  Substance and Sexual Activity   Alcohol use: Yes    Comment: occ   Drug use: No   Sexual activity: Not on file  Other Topics Concern   Not on file  Social History Narrative   ** Merged History Encounter **       Social Drivers of Health   Financial Resource Strain: Not on file  Food Insecurity: No Food Insecurity (06/09/2024)   Hunger Vital Sign    Worried About Running Out of Food in the Last Year: Never true    Ran Out of Food in the Last Year: Never true  Transportation Needs: No Transportation Needs (06/09/2024)   PRAPARE - Administrator, Civil Service (Medical): No    Lack of Transportation (Non-Medical): No  Physical  Activity: Not on file  Stress: Not on file  Social Connections: Not on file   No family history on file. No Known Allergies Current Outpatient Medications  Medication Sig Dispense Refill   aspirin  EC 325 MG tablet Take 1 tablet (325 mg total) by mouth daily. 14 tablet 0   Cholecalciferol (VITAMIN D3) 50 MCG (2000 UT) TABS Take 1 tablet by mouth daily.     diclofenac  (CATAFLAM ) 50 MG tablet Take 1 tablet (50 mg total) by mouth 3 (three) times daily. (Patient not taking: Reported on 05/11/2018) 21 tablet 0   diphenhydrAMINE HCl (BENADRYL ALLERGY PO) Take 0.5-1 tablets by mouth as needed.     omeprazole  (PRILOSEC) 20 MG capsule Take 1 capsule (20 mg total) by mouth daily. 30 capsule 0   ondansetron  (ZOFRAN  ODT) 4 MG disintegrating tablet Take 1 tablet (4 mg total) by mouth every 8 (eight) hours as needed for nausea or vomiting. (Patient not taking: Reported on 05/11/2018) 10 tablet 0   ondansetron  (ZOFRAN ) 4 MG tablet Take 1 tablet (4 mg total) by mouth every 6 (six) hours. 12 tablet 0   oxyCODONE  (ROXICODONE ) 5 MG immediate release tablet Take 1 tablet (5 mg total) by mouth every 4 (four)  hours as needed for severe pain (pain score 7-10) or breakthrough pain. 20 tablet 0   ranitidine  (ZANTAC ) 150 MG capsule Take 1 capsule (150 mg total) by mouth daily. 30 capsule 0   No current facility-administered medications for this visit.   No results found.  Review of Systems:   A ROS was performed including pertinent positives and negatives as documented in the HPI.   Musculoskeletal Exam:    Last menstrual period 06/02/2024.  Right incision is well-appearing without erythema or drainage.  There are some swelling about the foot with warm well-perfused toes.  Fires EHL as well as tibialis anterior gastrosoleus.  Left wrist with tenderness about the scapholunate joint.  Imaging:    3 views right ankle, 3 views left wrist: There appears to be a scapholunate injury of the left wrist with widening,  status post right ankle pilon fixation without complication  I personally reviewed and interpreted the radiographs.   Assessment:   6 weeks status post right pilon open reduction internal fixation doing well.  At this time she will progress her weightbearing to as tolerated.  She will continue to work through physical therapy.  She will switch to crutches.  I will plan to see her back in show 4 weeks for reassessment  Plan :    - Return to clinic 4 weeks for reassessment      I personally saw and evaluated the patient, and participated in the management and treatment plan.  Elspeth Parker, MD Attending Physician, Orthopedic Surgery  This document was dictated using Dragon voice recognition software. A reasonable attempt at proof reading has been made to minimize errors.

## 2024-07-23 ENCOUNTER — Encounter: Payer: Self-pay | Admitting: Physical Therapy

## 2024-07-23 ENCOUNTER — Ambulatory Visit: Payer: Self-pay | Admitting: Physical Therapy

## 2024-07-23 DIAGNOSIS — R262 Difficulty in walking, not elsewhere classified: Secondary | ICD-10-CM

## 2024-07-23 DIAGNOSIS — M25571 Pain in right ankle and joints of right foot: Secondary | ICD-10-CM | POA: Diagnosis not present

## 2024-07-23 DIAGNOSIS — M6281 Muscle weakness (generalized): Secondary | ICD-10-CM
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
## 2024-07-30 ENCOUNTER — Encounter: Payer: Self-pay | Admitting: Physical Therapy

## 2024-07-30 ENCOUNTER — Ambulatory Visit: Payer: Self-pay | Admitting: Physical Therapy

## 2024-07-30 DIAGNOSIS — M25571 Pain in right ankle and joints of right foot: Secondary | ICD-10-CM | POA: Diagnosis not present

## 2024-07-30 DIAGNOSIS — M6281 Muscle weakness (generalized): Secondary | ICD-10-CM

## 2024-07-30 DIAGNOSIS — R262 Difficulty in walking, not elsewhere classified: Secondary | ICD-10-CM
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
## 2024-07-31 ENCOUNTER — Ambulatory Visit (INDEPENDENT_AMBULATORY_CARE_PROVIDER_SITE_OTHER)

## 2024-07-31 ENCOUNTER — Ambulatory Visit (INDEPENDENT_AMBULATORY_CARE_PROVIDER_SITE_OTHER): Admitting: Student

## 2024-07-31 ENCOUNTER — Telehealth (HOSPITAL_BASED_OUTPATIENT_CLINIC_OR_DEPARTMENT_OTHER): Payer: Self-pay | Admitting: Orthopaedic Surgery

## 2024-07-31 DIAGNOSIS — S82891B Other fracture of right lower leg, initial encounter for open fracture type I or II: Secondary | ICD-10-CM

## 2024-07-31 NOTE — Telephone Encounter (Signed)
 Patient states she does not know if she can come in this week because of transpiration. She sill call back to confirm
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
## 2024-08-04 ENCOUNTER — Encounter (HOSPITAL_BASED_OUTPATIENT_CLINIC_OR_DEPARTMENT_OTHER): Payer: Self-pay | Admitting: Orthopaedic Surgery

## 2024-08-06 ENCOUNTER — Ambulatory Visit: Payer: Self-pay | Attending: Orthopaedic Surgery | Admitting: Physical Therapy

## 2024-08-06 ENCOUNTER — Encounter: Payer: Self-pay | Admitting: Physical Therapy

## 2024-08-06 DIAGNOSIS — M6281 Muscle weakness (generalized): Secondary | ICD-10-CM | POA: Insufficient documentation

## 2024-08-06 DIAGNOSIS — M25571 Pain in right ankle and joints of right foot: Secondary | ICD-10-CM | POA: Diagnosis present

## 2024-08-06 DIAGNOSIS — R262 Difficulty in walking, not elsewhere classified: Secondary | ICD-10-CM | POA: Diagnosis present
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
## 2024-08-13 ENCOUNTER — Ambulatory Visit: Payer: Self-pay | Admitting: Physical Therapy

## 2024-08-13 DIAGNOSIS — M25571 Pain in right ankle and joints of right foot: Secondary | ICD-10-CM | POA: Diagnosis not present

## 2024-08-13 DIAGNOSIS — R262 Difficulty in walking, not elsewhere classified: Secondary | ICD-10-CM

## 2024-08-13 DIAGNOSIS — M6281 Muscle weakness (generalized): Secondary | ICD-10-CM
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
## 2024-08-16 ENCOUNTER — Encounter (HOSPITAL_BASED_OUTPATIENT_CLINIC_OR_DEPARTMENT_OTHER): Admitting: Orthopaedic Surgery

## 2024-08-17 ENCOUNTER — Encounter: Payer: Self-pay | Admitting: Physical Therapy

## 2024-08-17 ENCOUNTER — Ambulatory Visit: Admitting: Physical Therapy

## 2024-08-17 DIAGNOSIS — M25571 Pain in right ankle and joints of right foot: Secondary | ICD-10-CM

## 2024-08-17 DIAGNOSIS — M6281 Muscle weakness (generalized): Secondary | ICD-10-CM
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
## 2024-08-20 ENCOUNTER — Ambulatory Visit: Admitting: Physical Therapy

## 2024-08-20 ENCOUNTER — Encounter: Payer: Self-pay | Admitting: Physical Therapy

## 2024-08-20 DIAGNOSIS — M6281 Muscle weakness (generalized): Secondary | ICD-10-CM

## 2024-08-20 DIAGNOSIS — M25571 Pain in right ankle and joints of right foot: Secondary | ICD-10-CM | POA: Diagnosis not present

## 2024-08-20 DIAGNOSIS — R262 Difficulty in walking, not elsewhere classified: Secondary | ICD-10-CM
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
## 2024-08-22 ENCOUNTER — Ambulatory Visit (HOSPITAL_BASED_OUTPATIENT_CLINIC_OR_DEPARTMENT_OTHER)

## 2024-08-22 ENCOUNTER — Ambulatory Visit (INDEPENDENT_AMBULATORY_CARE_PROVIDER_SITE_OTHER): Admitting: Orthopaedic Surgery

## 2024-08-22 DIAGNOSIS — S82891B Other fracture of right lower leg, initial encounter for open fracture type I or II: Secondary | ICD-10-CM

## 2024-08-22 NOTE — Progress Notes (Signed)
 Post Operative Evaluation    Procedure/Date of Surgery: Right pilon fixation 8/9  Interval History:   Presents for follow-up 2-1/15-month status post above procedure.  Overall she is doing well.  Denies any significant pain just occasional swelling   PMH/PSH/Family History/Social History/Meds/Allergies:   No past medical history on file. Past Surgical History:  Procedure Laterality Date   INCISION AND DRAINAGE OF WOUND Left 06/09/2024   Procedure: IRRIGATION AND DEBRIDEMENT OF LARGE LACERATION LEFT KNEE;  Surgeon: Genelle Standing, MD;  Location: [facility] OR;  Service: Orthopedics;  Laterality: Left;   ORIF ANKLE FRACTURE Right 06/09/2024   Procedure: OPEN REDUCTION INTERNAL FIXATION (ORIF) RIGHT ANKLE FRACTURE;  Surgeon: Genelle Standing, MD;  Location: [facility] OR;  Service: Orthopedics;  Laterality: Right;   Social History   Socioeconomic History   Marital status: Single    Spouse name: Not on file   Number of children: Not on file   Years of education: Not on file   Highest education level: Not on file  Occupational History   Not on file  Tobacco Use   Smoking status: Some Days    Types: Cigarettes   Smokeless tobacco: Never  Vaping Use   Vaping status: Every Day  Substance and Sexual Activity   Alcohol use: Yes    Comment: occ   Drug use: No   Sexual activity: Not on file  Other Topics Concern   Not on file  Social History Narrative   ** Merged History Encounter **       Social Drivers of Health   Financial Resource Strain: Not on file  Food Insecurity: No Food Insecurity (06/09/2024)   Hunger Vital Sign    Worried About Running Out of Food in the Last Year: Never true    Ran Out of Food in the Last Year: Never true  Transportation Needs: No Transportation Needs (06/09/2024)   PRAPARE - Administrator, Civil Service (Medical): No    Lack of Transportation (Non-Medical): No  Physical Activity: Not on file  Stress: Not on file   Social Connections: Not on file   No family history on file. No Known Allergies Current Outpatient Medications  Medication Sig Dispense Refill   aspirin  EC 325 MG tablet Take 1 tablet (325 mg total) by mouth daily. 14 tablet 0   Cholecalciferol (VITAMIN D3) 50 MCG (2000 UT) TABS Take 1 tablet by mouth daily.     diclofenac  (CATAFLAM ) 50 MG tablet Take 1 tablet (50 mg total) by mouth 3 (three) times daily. (Patient not taking: Reported on 05/11/2018) 21 tablet 0   diphenhydrAMINE HCl (BENADRYL ALLERGY PO) Take 0.5-1 tablets by mouth as needed.     omeprazole  (PRILOSEC) 20 MG capsule Take 1 capsule (20 mg total) by mouth daily. 30 capsule 0   ondansetron  (ZOFRAN  ODT) 4 MG disintegrating tablet Take 1 tablet (4 mg total) by mouth every 8 (eight) hours as needed for nausea or vomiting. (Patient not taking: Reported on 05/11/2018) 10 tablet 0   ondansetron  (ZOFRAN ) 4 MG tablet Take 1 tablet (4 mg total) by mouth every 6 (six) hours. 12 tablet 0   oxyCODONE  (ROXICODONE ) 5 MG immediate release tablet Take 1 tablet (5 mg total) by mouth every 4 (four) hours as needed for severe pain (pain score 7-10) or breakthrough pain. 20  tablet 0   ranitidine  (ZANTAC ) 150 MG capsule Take 1 capsule (150 mg total) by mouth daily. 30 capsule 0   No current facility-administered medications for this visit.   No results found.  Review of Systems:   A ROS was performed including pertinent positives and negatives as documented in the HPI.   Musculoskeletal Exam:    Last menstrual period 07/20/2024.  Right incision is well-appearing without erythema or drainage.  There are some swelling about the foot with warm well-perfused toes.  Fires EHL as well as tibialis anterior gastrosoleus.  Left wrist with tenderness about the scapholunate joint.  Imaging:    3 views right ankle, 3 views left wrist: There appears to be a scapholunate injury of the left wrist with widening, status post right ankle pilon fixation  without complication  I personally reviewed and interpreted the radiographs.   Assessment:   12 weeks status post right pilon open reduction internal fixation doing well.  At this time I will plan to see her back in 2 months for reassessment  Plan :    - Return to clinic 8 weeks for reassessment      I personally saw and evaluated the patient, and participated in the management and treatment plan.  Elspeth Parker, MD Attending Physician, Orthopedic Surgery  This document was dictated using Dragon voice recognition software. A reasonable attempt at proof reading has been made to minimize errors.

## 2024-08-24 ENCOUNTER — Ambulatory Visit: Admitting: Physical Therapy

## 2024-08-24 ENCOUNTER — Encounter: Payer: Self-pay | Admitting: Physical Therapy

## 2024-08-24 DIAGNOSIS — M25571 Pain in right ankle and joints of right foot: Secondary | ICD-10-CM

## 2024-08-24 DIAGNOSIS — M6281 Muscle weakness (generalized): Secondary | ICD-10-CM
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
## 2024-08-27 ENCOUNTER — Ambulatory Visit: Admitting: Physical Therapy

## 2024-08-27 DIAGNOSIS — M25571 Pain in right ankle and joints of right foot: Secondary | ICD-10-CM | POA: Diagnosis not present

## 2024-08-27 DIAGNOSIS — R262 Difficulty in walking, not elsewhere classified: Secondary | ICD-10-CM

## 2024-08-27 DIAGNOSIS — M6281 Muscle weakness (generalized): Secondary | ICD-10-CM
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
## 2024-08-30 ENCOUNTER — Ambulatory Visit: Admitting: Physical Therapy

## 2024-09-03 ENCOUNTER — Encounter: Payer: Self-pay | Admitting: Radiology

## 2024-09-04 ENCOUNTER — Ambulatory Visit: Attending: Orthopaedic Surgery | Admitting: Physical Therapy

## 2024-09-04 DIAGNOSIS — R262 Difficulty in walking, not elsewhere classified: Secondary | ICD-10-CM | POA: Diagnosis present

## 2024-09-04 DIAGNOSIS — M25571 Pain in right ankle and joints of right foot: Secondary | ICD-10-CM | POA: Insufficient documentation

## 2024-09-04 DIAGNOSIS — M6281 Muscle weakness (generalized): Secondary | ICD-10-CM | POA: Insufficient documentation
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
## 2024-09-17 ENCOUNTER — Ambulatory Visit: Admitting: Physical Therapy

## 2024-09-17 ENCOUNTER — Encounter: Payer: Self-pay | Admitting: Physical Therapy

## 2024-09-17 DIAGNOSIS — M6281 Muscle weakness (generalized): Secondary | ICD-10-CM

## 2024-09-17 DIAGNOSIS — R262 Difficulty in walking, not elsewhere classified: Secondary | ICD-10-CM

## 2024-09-17 DIAGNOSIS — M25571 Pain in right ankle and joints of right foot: Secondary | ICD-10-CM | POA: Diagnosis not present
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
## 2024-09-24 ENCOUNTER — Encounter: Payer: Self-pay | Admitting: Physical Therapy

## 2024-09-24 ENCOUNTER — Ambulatory Visit: Admitting: Physical Therapy

## 2024-09-24 DIAGNOSIS — M25571 Pain in right ankle and joints of right foot: Secondary | ICD-10-CM | POA: Diagnosis not present

## 2024-09-24 DIAGNOSIS — R262 Difficulty in walking, not elsewhere classified: Secondary | ICD-10-CM
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
## 2024-10-01 ENCOUNTER — Encounter: Payer: Self-pay | Admitting: Physical Therapy

## 2024-10-01 ENCOUNTER — Ambulatory Visit: Admitting: Physical Therapy

## 2024-10-01 DIAGNOSIS — M25571 Pain in right ankle and joints of right foot: Secondary | ICD-10-CM | POA: Insufficient documentation

## 2024-10-01 DIAGNOSIS — R262 Difficulty in walking, not elsewhere classified: Secondary | ICD-10-CM | POA: Insufficient documentation

## 2024-10-01 DIAGNOSIS — M6281 Muscle weakness (generalized): Secondary | ICD-10-CM | POA: Diagnosis present
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
## 2024-10-09 ENCOUNTER — Ambulatory Visit: Admitting: Physical Therapy

## 2024-10-09 DIAGNOSIS — M25571 Pain in right ankle and joints of right foot: Secondary | ICD-10-CM | POA: Diagnosis not present

## 2024-10-09 DIAGNOSIS — R262 Difficulty in walking, not elsewhere classified: Secondary | ICD-10-CM

## 2024-10-09 DIAGNOSIS — M6281 Muscle weakness (generalized): Secondary | ICD-10-CM
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
## 2024-10-12 ENCOUNTER — Ambulatory Visit (INDEPENDENT_AMBULATORY_CARE_PROVIDER_SITE_OTHER): Admitting: Orthopaedic Surgery

## 2024-10-12 ENCOUNTER — Ambulatory Visit (HOSPITAL_BASED_OUTPATIENT_CLINIC_OR_DEPARTMENT_OTHER)

## 2024-10-12 ENCOUNTER — Other Ambulatory Visit (HOSPITAL_BASED_OUTPATIENT_CLINIC_OR_DEPARTMENT_OTHER): Payer: Self-pay

## 2024-10-12 DIAGNOSIS — S82891B Other fracture of right lower leg, initial encounter for open fracture type I or II: Secondary | ICD-10-CM | POA: Diagnosis not present

## 2024-10-12 DIAGNOSIS — M79661 Pain in right lower leg: Secondary | ICD-10-CM | POA: Diagnosis not present

## 2024-10-12 MED ORDER — LIDOCAINE HCL 1 % IJ SOLN
4.0000 mL | INTRAMUSCULAR | Status: AC | PRN
  Administered 2024-10-12: 4 mL

## 2024-10-12 MED ORDER — TRIAMCINOLONE ACETONIDE 40 MG/ML IJ SUSP
80.0000 mg | INTRAMUSCULAR | Status: AC | PRN
  Administered 2024-10-12: 80 mg via INTRA_ARTICULAR

## 2024-10-12 NOTE — Progress Notes (Signed)
 Post Operative Evaluation    Procedure/Date of Surgery: Right pilon fixation 8/9  Interval History:   Presents for follow-up status post above procedure.  She is having some pain and tenderness particularly medially.  She is having some pain in the joint as well   PMH/PSH/Family History/Social History/Meds/Allergies:   No past medical history on file. Past Surgical History:  Procedure Laterality Date   INCISION AND DRAINAGE OF WOUND Left 06/09/2024   Procedure: IRRIGATION AND DEBRIDEMENT OF LARGE LACERATION LEFT KNEE;  Surgeon: Genelle Standing, MD;  Location: [facility] OR;  Service: Orthopedics;  Laterality: Left;   ORIF ANKLE FRACTURE Right 06/09/2024   Procedure: OPEN REDUCTION INTERNAL FIXATION (ORIF) RIGHT ANKLE FRACTURE;  Surgeon: Genelle Standing, MD;  Location: [facility] OR;  Service: Orthopedics;  Laterality: Right;   Social History   Socioeconomic History   Marital status: Single    Spouse name: Not on file   Number of children: Not on file   Years of education: Not on file   Highest education level: Not on file  Occupational History   Not on file  Tobacco Use   Smoking status: Some Days    Types: Cigarettes   Smokeless tobacco: Never  Vaping Use   Vaping status: Every Day  Substance and Sexual Activity   Alcohol use: Yes    Comment: occ   Drug use: No   Sexual activity: Not on file  Other Topics Concern   Not on file  Social History Narrative   ** Merged History Encounter **       Social Drivers of Health   Tobacco Use: High Risk (10/01/2024)   Patient History    Smoking Tobacco Use: Some Days    Smokeless Tobacco Use: Never    Passive Exposure: Not on file  Financial Resource Strain: Not on file  Food Insecurity: No Food Insecurity (06/09/2024)   Epic    Worried About Programme Researcher, Broadcasting/film/video in the Last Year: Never true    Ran Out of Food in the Last Year: Never true  Transportation Needs: No Transportation Needs  (06/09/2024)   Epic    Lack of Transportation (Medical): No    Lack of Transportation (Non-Medical): No  Physical Activity: Not on file  Stress: Not on file  Social Connections: Not on file  Depression (EYV7-0): Not on file  Alcohol Screen: Not on file  Housing: Low Risk (06/09/2024)   Epic    Unable to Pay for Housing in the Last Year: No    Number of Times Moved in the Last Year: 0    Homeless in the Last Year: No  Utilities: Not At Risk (06/09/2024)   Epic    Threatened with loss of utilities: No  Health Literacy: Not on file   No family history on file. No Known Allergies Current Outpatient Medications  Medication Sig Dispense Refill   aspirin  EC 325 MG tablet Take 1 tablet (325 mg total) by mouth daily. 14 tablet 0   Cholecalciferol (VITAMIN D3) 50 MCG (2000 UT) TABS Take 1 tablet by mouth daily.     diclofenac  (CATAFLAM ) 50 MG tablet Take 1 tablet (50 mg total) by mouth 3 (three) times daily. (Patient not taking: Reported on 05/11/2018) 21 tablet 0   diphenhydrAMINE HCl (BENADRYL ALLERGY PO) Take 0.5-1 tablets by mouth as  needed.     omeprazole  (PRILOSEC) 20 MG capsule Take 1 capsule (20 mg total) by mouth daily. 30 capsule 0   ondansetron  (ZOFRAN  ODT) 4 MG disintegrating tablet Take 1 tablet (4 mg total) by mouth every 8 (eight) hours as needed for nausea or vomiting. (Patient not taking: Reported on 05/11/2018) 10 tablet 0   ondansetron  (ZOFRAN ) 4 MG tablet Take 1 tablet (4 mg total) by mouth every 6 (six) hours. 12 tablet 0   oxyCODONE  (ROXICODONE ) 5 MG immediate release tablet Take 1 tablet (5 mg total) by mouth every 4 (four) hours as needed for severe pain (pain score 7-10) or breakthrough pain. 20 tablet 0   ranitidine  (ZANTAC ) 150 MG capsule Take 1 capsule (150 mg total) by mouth daily. 30 capsule 0   No current facility-administered medications for this visit.   No results found.  Review of Systems:   A ROS was performed including pertinent positives and  negatives as documented in the HPI.   Musculoskeletal Exam:    There were no vitals taken for this visit.  Right incision is well-appearing without erythema or drainage.  There are some swelling about the foot with warm well-perfused toes.  Fires EHL as well as tibialis anterior gastrosoleus.  Left wrist with tenderness about the scapholunate joint.  Imaging:    3 views right ankle, 3 views left wrist: There appears to be a scapholunate injury of the left wrist with widening, status post right ankle pilon fixation without complication  I personally reviewed and interpreted the radiographs.   Assessment:   Status post right pilon open reduction internal fixation doing well.  I have recommended ultrasound-guided injection to help with her ankle joint pain.  I would like to obtain a CT scan to further assess the medial malleolar healing as this is difficult to assess on x-ray Plan :    - Return to clinic 2 months    Procedure Note  Patient: Robin Brandt             Date of Birth: 22-Mar-1986           MRN: [id number]             Visit Date: 10/12/2024  Procedures: Visit Diagnoses:  1. Type I or II open fracture of right ankle, initial encounter     Medium Joint Inj: R ankle on 10/12/2024 12:20 PM Indications: pain Details: 22 G 1.5 in needle, ultrasound-guided anterior approach Medications: 4 mL lidocaine  1 %; 80 mg triamcinolone acetonide 40 MG/ML Outcome: tolerated well, no immediate complications Immediately prior to procedure a time out was called to verify the correct patient, procedure, equipment, support staff and site/side marked as required. Patient was prepped and draped in the usual sterile fashion.         I personally saw and evaluated the patient, and participated in the management and treatment plan.  Elspeth Parker, MD Attending Physician, Orthopedic Surgery  This document was dictated using Dragon voice recognition software. A reasonable  attempt at proof reading has been made to minimize errors.

## 2024-10-12 NOTE — Addendum Note (Signed)
 Addended by: WOLFGANG CONLEY HERO on: 10/12/2024 03:03 PM   Modules accepted: Orders

## 2024-10-15 ENCOUNTER — Ambulatory Visit: Payer: Self-pay | Admitting: Physical Therapy

## 2024-10-15 ENCOUNTER — Encounter: Payer: Self-pay | Admitting: Physical Therapy

## 2024-10-15 DIAGNOSIS — M25571 Pain in right ankle and joints of right foot: Secondary | ICD-10-CM | POA: Diagnosis not present

## 2024-10-15 DIAGNOSIS — M6281 Muscle weakness (generalized): Secondary | ICD-10-CM
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
## 2024-10-22 ENCOUNTER — Ambulatory Visit: Payer: Self-pay | Admitting: Physical Therapy

## 2024-11-07 ENCOUNTER — Encounter (HOSPITAL_BASED_OUTPATIENT_CLINIC_OR_DEPARTMENT_OTHER): Payer: Self-pay | Admitting: Orthopaedic Surgery

## 2024-11-14 ENCOUNTER — Ambulatory Visit (HOSPITAL_BASED_OUTPATIENT_CLINIC_OR_DEPARTMENT_OTHER)
Admission: RE | Admit: 2024-11-14 | Discharge: 2024-11-14 | Disposition: A | Discharge status: Home / Self Care | Source: Ambulatory Visit | Attending: Orthopaedic Surgery | Admitting: Orthopaedic Surgery

## 2024-11-14 DIAGNOSIS — S82891B Other fracture of right lower leg, initial encounter for open fracture type I or II: Secondary | ICD-10-CM | POA: Diagnosis present

## 2024-11-15 ENCOUNTER — Ambulatory Visit
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
## 2024-11-16 ENCOUNTER — Encounter (HOSPITAL_BASED_OUTPATIENT_CLINIC_OR_DEPARTMENT_OTHER): Payer: Self-pay | Admitting: Orthopaedic Surgery

## 2024-11-21 ENCOUNTER — Ambulatory Visit (INDEPENDENT_AMBULATORY_CARE_PROVIDER_SITE_OTHER): Admitting: Orthopaedic Surgery

## 2024-11-21 ENCOUNTER — Other Ambulatory Visit (HOSPITAL_BASED_OUTPATIENT_CLINIC_OR_DEPARTMENT_OTHER): Payer: Self-pay | Admitting: Orthopaedic Surgery

## 2024-11-21 ENCOUNTER — Telehealth (HOSPITAL_BASED_OUTPATIENT_CLINIC_OR_DEPARTMENT_OTHER): Payer: Self-pay | Admitting: Orthopaedic Surgery

## 2024-11-21 DIAGNOSIS — S82891B Other fracture of right lower leg, initial encounter for open fracture type I or II: Secondary | ICD-10-CM | POA: Diagnosis not present

## 2024-11-21 MED ORDER — MELOXICAM 15 MG PO TABS: 15.0000 mg | ORAL_TABLET | Freq: Every day | ORAL | 3 refills | Status: AC

## 2024-11-21 NOTE — Progress Notes (Signed)
 "                                Post Operative Evaluation    Procedure/Date of Surgery: Right pilon fixation 8/9  Interval History:   Presents for follow-up status post above procedure.  She is here today for CT scan discussion.  She is still having some occasional sharp pains in the ankle   PMH/PSH/Family History/Social History/Meds/Allergies:   No past medical history on file. Past Surgical History:  Procedure Laterality Date   INCISION AND DRAINAGE OF WOUND Left 06/09/2024   Procedure: IRRIGATION AND DEBRIDEMENT OF LARGE LACERATION LEFT KNEE;  Surgeon: Genelle Standing, MD;  Location: [facility] OR;  Service: Orthopedics;  Laterality: Left;   ORIF ANKLE FRACTURE Right 06/09/2024   Procedure: OPEN REDUCTION INTERNAL FIXATION (ORIF) RIGHT ANKLE FRACTURE;  Surgeon: Genelle Standing, MD;  Location: [facility] OR;  Service: Orthopedics;  Laterality: Right;   Social History   Socioeconomic History   Marital status: Single    Spouse name: Not on file   Number of children: Not on file   Years of education: Not on file   Highest education level: Not on file  Occupational History   Not on file  Tobacco Use   Smoking status: Some Days    Types: Cigarettes   Smokeless tobacco: Never  Vaping Use   Vaping status: Every Day  Substance and Sexual Activity   Alcohol use: Yes    Comment: occ   Drug use: No   Sexual activity: Not on file  Other Topics Concern   Not on file  Social History Narrative   ** Merged History Encounter **       Social Drivers of Health   Tobacco Use: High Risk (10/15/2024)   Patient History    Smoking Tobacco Use: Some Days    Smokeless Tobacco Use: Never    Passive Exposure: Not on file  Financial Resource Strain: Not on file  Food Insecurity: No Food Insecurity (06/09/2024)   Epic    Worried About Programme Researcher, Broadcasting/film/video in the Last Year: Never true    Ran Out of Food in the Last Year: Never true  Transportation Needs: No Transportation Needs (06/09/2024)   Epic    Lack  of Transportation (Medical): No    Lack of Transportation (Non-Medical): No  Physical Activity: Not on file  Stress: Not on file  Social Connections: Not on file  Depression (EYV7-0): Not on file  Alcohol Screen: Not on file  Housing: Low Risk (06/09/2024)   Epic    Unable to Pay for Housing in the Last Year: No    Number of Times Moved in the Last Year: 0    Homeless in the Last Year: No  Utilities: Not At Risk (06/09/2024)   Epic    Threatened with loss of utilities: No  Health Literacy: Not on file   No family history on file. No Known Allergies Current Outpatient Medications  Medication Sig Dispense Refill   aspirin  EC 325 MG tablet Take 1 tablet (325 mg total) by mouth daily. 14 tablet 0   Cholecalciferol (VITAMIN D3) 50 MCG (2000 UT) TABS Take 1 tablet by mouth daily.     diclofenac  (CATAFLAM ) 50 MG tablet Take 1 tablet (50 mg total) by mouth 3 (three) times daily. (Patient not taking: Reported on 05/11/2018) 21 tablet 0   diphenhydrAMINE HCl (BENADRYL ALLERGY PO) Take 0.5-1 tablets by mouth  as needed.     omeprazole  (PRILOSEC) 20 MG capsule Take 1 capsule (20 mg total) by mouth daily. 30 capsule 0   ondansetron  (ZOFRAN  ODT) 4 MG disintegrating tablet Take 1 tablet (4 mg total) by mouth every 8 (eight) hours as needed for nausea or vomiting. (Patient not taking: Reported on 05/11/2018) 10 tablet 0   ondansetron  (ZOFRAN ) 4 MG tablet Take 1 tablet (4 mg total) by mouth every 6 (six) hours. 12 tablet 0   oxyCODONE  (ROXICODONE ) 5 MG immediate release tablet Take 1 tablet (5 mg total) by mouth every 4 (four) hours as needed for severe pain (pain score 7-10) or breakthrough pain. 20 tablet 0   ranitidine  (ZANTAC ) 150 MG capsule Take 1 capsule (150 mg total) by mouth daily. 30 capsule 0   No current facility-administered medications for this visit.   No results found.  Review of Systems:   A ROS was performed including pertinent positives and negatives as documented in the  HPI.   Musculoskeletal Exam:    There were no vitals taken for this visit.  Right incision is well-appearing without erythema or drainage.  There are some swelling about the foot with warm well-perfused toes.  Fires EHL as well as tibialis anterior gastrosoleus.  Left wrist with tenderness about the scapholunate joint.  Imaging:    3 views right ankle, 3 views left wrist: There appears to be a scapholunate injury of the left wrist with widening, status post right ankle pilon fixation without complication  CT scan left ankle: There is interval healing about her fracture site with a fairly large erosion of the posterior talus  I personally reviewed and interpreted the radiographs.   Assessment:   Status post right pilon open reduction internal fixation with continued soreness and pain.  I did discuss that her CT scan does show evidence of continued healing.  She does have a significant erosion involving her talus secondary to the injury.  I did discuss that I would like to plan to see her back in 2 months for repeat x-ray.  Once she has more complete healing I did discuss the possibility of a removal of hardware with ankle arthroscopy to address her cartilage.  I will plan to see her back in 2 months  Plan :    - Return to clinic 2 months      I personally saw and evaluated the patient, and participated in the management and treatment plan.  Elspeth Parker, MD Attending Physician, Orthopedic Surgery  This document was dictated using Dragon voice recognition software. A reasonable attempt at proof reading has been made to minimize errors. "

## 2024-11-21 NOTE — Telephone Encounter (Signed)
 Patient states that Dr B was suppose to send her meds to be sent to  pharmacy [facility] on [address]

## 2024-12-12 ENCOUNTER — Ambulatory Visit (HOSPITAL_BASED_OUTPATIENT_CLINIC_OR_DEPARTMENT_OTHER): Admitting: Orthopaedic Surgery

## 2025-01-02 ENCOUNTER — Ambulatory Visit (HOSPITAL_BASED_OUTPATIENT_CLINIC_OR_DEPARTMENT_OTHER): Admitting: Orthopaedic Surgery
